# Patient Record
Sex: Female | Born: 1970
Health system: Southern US, Community
[De-identification: ages and names within clinical notes are randomized; demographics above are authoritative.]

## PROBLEM LIST (undated history)

## (undated) DIAGNOSIS — I319 Disease of pericardium, unspecified: Secondary | ICD-10-CM

## (undated) DIAGNOSIS — I776 Arteritis, unspecified: Secondary | ICD-10-CM

## (undated) DIAGNOSIS — M1990 Unspecified osteoarthritis, unspecified site: Secondary | ICD-10-CM

## (undated) HISTORY — PX: VARICOSE VEIN SURGERY: SHX832

---

## 1998-12-10 ENCOUNTER — Other Ambulatory Visit: Admission: RE | Admit: 1998-12-10 | Discharge: 1998-12-10 | Payer: Self-pay | Admitting: Obstetrics and Gynecology

## 2000-01-19 ENCOUNTER — Other Ambulatory Visit: Admission: RE | Admit: 2000-01-19 | Discharge: 2000-01-19 | Payer: Self-pay | Admitting: Obstetrics and Gynecology

## 2004-07-30 ENCOUNTER — Other Ambulatory Visit: Admission: RE | Admit: 2004-07-30 | Discharge: 2004-07-30 | Payer: Self-pay | Admitting: Obstetrics and Gynecology

## 2008-06-29 ENCOUNTER — Inpatient Hospital Stay (HOSPITAL_COMMUNITY): Admission: AD | Admit: 2008-06-29 | Discharge: 2008-06-29 | Payer: Self-pay | Admitting: Obstetrics & Gynecology

## 2011-03-08 ENCOUNTER — Ambulatory Visit: Payer: Self-pay | Admitting: Internal Medicine

## 2011-08-20 LAB — URINE CULTURE: Culture: NO GROWTH

## 2011-08-20 LAB — URINALYSIS, ROUTINE W REFLEX MICROSCOPIC
Glucose, UA: NEGATIVE
Glucose, UA: NEGATIVE
Ketones, ur: NEGATIVE
Protein, ur: NEGATIVE
Specific Gravity, Urine: 1.025

## 2011-08-20 LAB — URINE MICROSCOPIC-ADD ON

## 2011-08-20 LAB — POCT PREGNANCY, URINE: Preg Test, Ur: NEGATIVE

## 2011-08-20 LAB — WET PREP, GENITAL: Yeast Wet Prep HPF POC: NONE SEEN

## 2012-06-27 ENCOUNTER — Other Ambulatory Visit (HOSPITAL_COMMUNITY): Payer: Self-pay | Admitting: Physician Assistant

## 2012-06-27 DIAGNOSIS — Z1231 Encounter for screening mammogram for malignant neoplasm of breast: Secondary | ICD-10-CM

## 2012-07-12 ENCOUNTER — Ambulatory Visit (HOSPITAL_COMMUNITY)
Admission: RE | Admit: 2012-07-12 | Discharge: 2012-07-12 | Disposition: A | Discharge status: Home / Self Care | Payer: Self-pay | Source: Ambulatory Visit | Attending: Physician Assistant | Admitting: Physician Assistant

## 2012-07-12 DIAGNOSIS — Z1231 Encounter for screening mammogram for malignant neoplasm of breast: Secondary | ICD-10-CM

## 2013-05-03 ENCOUNTER — Emergency Department (HOSPITAL_COMMUNITY)
Admission: EM | Admit: 2013-05-03 | Discharge: 2013-05-03 | Disposition: A | Discharge status: Home / Self Care | Payer: Self-pay | Attending: Emergency Medicine | Admitting: Emergency Medicine

## 2013-05-03 ENCOUNTER — Encounter (HOSPITAL_COMMUNITY): Payer: Self-pay | Admitting: *Deleted

## 2013-05-03 DIAGNOSIS — M79609 Pain in unspecified limb: Secondary | ICD-10-CM

## 2013-05-03 DIAGNOSIS — IMO0001 Reserved for inherently not codable concepts without codable children: Secondary | ICD-10-CM | POA: Insufficient documentation

## 2013-05-03 DIAGNOSIS — M79605 Pain in left leg: Secondary | ICD-10-CM

## 2013-05-03 DIAGNOSIS — M7989 Other specified soft tissue disorders: Secondary | ICD-10-CM

## 2013-05-03 DIAGNOSIS — I8390 Asymptomatic varicose veins of unspecified lower extremity: Secondary | ICD-10-CM | POA: Insufficient documentation

## 2013-05-03 LAB — CBC WITH DIFFERENTIAL/PLATELET
Basophils Relative: 1 % (ref 0–1)
HCT: 37.5 % (ref 36.0–46.0)
Hemoglobin: 12.7 g/dL (ref 12.0–15.0)
Lymphs Abs: 2.7 10*3/uL (ref 0.7–4.0)
MCH: 30.7 pg (ref 26.0–34.0)
MCHC: 33.9 g/dL (ref 30.0–36.0)
Monocytes Absolute: 0.5 10*3/uL (ref 0.1–1.0)
Monocytes Relative: 7 % (ref 3–12)
Neutro Abs: 3.8 10*3/uL (ref 1.7–7.7)
Neutrophils Relative %: 52 % (ref 43–77)
RBC: 4.14 MIL/uL (ref 3.87–5.11)

## 2013-05-03 LAB — BASIC METABOLIC PANEL
BUN: 7 mg/dL (ref 6–23)
Chloride: 99 mEq/L (ref 96–112)
Creatinine, Ser: 0.94 mg/dL (ref 0.50–1.10)
Glucose, Bld: 90 mg/dL (ref 70–99)
Potassium: 3.8 mEq/L (ref 3.5–5.1)

## 2013-05-03 MED ORDER — TRAMADOL HCL 50 MG PO TABS: 50.0000 mg | ORAL_TABLET | Freq: Four times a day (QID) | ORAL | Status: DC | PRN

## 2013-05-03 NOTE — ED Notes (Signed)
Pt reports bilateral leg pain, pain worse to right leg. Pain described as achy and burning sensation. Right calf slightly swollen and pt reports "discoloration" from her norm. Has been going on for past two weeks. Pt on feet all day for work. Elevating leg helps relieve pain. Walking makes pain worse. Hx of varicose veins. Denies hx of blood clots

## 2013-05-03 NOTE — Progress Notes (Signed)
VASCULAR LAB PRELIMINARY  PRELIMINARY  PRELIMINARY  PRELIMINARY  Bilateral lower extremity venous duplex completed.    Preliminary report:  Bilateral:  No evidence of DVT, superficial thrombosis, or Baker's Cyst.   Reana Chacko, RVS 05/03/2013, 9:02 AM

## 2013-05-03 NOTE — ED Provider Notes (Signed)
Medical screening examination/treatment/procedure(s) were performed by non-physician practitioner and as supervising physician I was immediately available for consultation/collaboration.  Ethelda Chick, MD 05/03/13 [phone]

## 2013-05-03 NOTE — ED Notes (Signed)
Vascular tech at bedside. °

## 2013-05-03 NOTE — ED Provider Notes (Signed)
History     CSN: [id number]  Arrival date & time 05/03/13  4098   First MD Initiated Contact with Patient 05/03/13 0745      No chief complaint on file.   (Consider location/radiation/quality/duration/timing/severity/associated sxs/prior treatment) HPI Comments: Patient presents to the ED for bilateral lower extremity pain. Patient has history of varicose veins states that the past few days her legs have grown increasingly sore with calf swelling, cramping, and a sense of heaviness of BLE.  Pain worse with prolonged standing, elevating legs helps some.  She also has new bruises on the LE without history of trauma or injury.  No hx of thrombocytopenia.  No hx of DVT or PE.  Has had prior laser procedure for varicose veins by Dr. Guss Bunde and notes that she does have poor circulation.  Pt is not on lasix or blood thinners.  Denies any chest pain or SOB.  The history is provided by the patient.    No past medical history on file.  No past surgical history on file.  No family history on file.  History  Substance Use Topics  . Smoking status: Not on file  . Smokeless tobacco: Not on file  . Alcohol Use: Not on file    OB History   No data available      Review of Systems  Musculoskeletal: Positive for myalgias.  All other systems reviewed and are negative.    Allergies  Review of patient's allergies indicates not on file.  Home Medications  No current outpatient prescriptions on file.  BP 133/66  Pulse 82  Temp(Src) 97.9 F (36.6 C) (Oral)  Resp 18  SpO2 99%  Physical Exam  Nursing note and vitals reviewed. Constitutional: She is oriented to person, place, and time. She appears well-developed and well-nourished.  HENT:  Head: Normocephalic and atraumatic.  Eyes: Conjunctivae and EOM are normal.  Neck: Normal range of motion. Neck supple.  Cardiovascular: Normal rate, regular rhythm and normal heart sounds.   Pulmonary/Chest: Effort normal and breath  sounds normal. No respiratory distress. She has no wheezes.  Musculoskeletal: Normal range of motion.  Varicose veins and calf tenderness of BLE with mild asymmetry of right calf, no palpable cord, negative Homan's sign  Neurological: She is alert and oriented to person, place, and time. She has normal strength. No cranial nerve deficit or sensory deficit.  Skin: Skin is warm and dry.  Psychiatric: She has a normal mood and affect.    ED Course  Procedures (including critical care time)  Labs Reviewed  BASIC METABOLIC PANEL - Abnormal; Notable for the following:    Sodium 134 (*)    GFR calc non Af Amer 75 (*)    GFR calc Af Amer 87 (*)    All other components within normal limits  CBC WITH DIFFERENTIAL   No results found.   1. Varicose veins   2. Leg pain, bilateral       MDM   Pt concerned for DVT- bilateral LE dopplers ordered.  CBC and BMP to evaluate for thrombocytopenia and electrolyte imbalances to cause the sporadic bruising and cramping.  9:04 AM Doppler's negative for DVT.  Labs largely WNL.  Sx likely due to varicose veins.  Will have pt FU with Dr. Guss Bunde at vein clinic.  Instructed to elevate and rest her legs today.  Rx tramadol.  Discussed plan with pt, she agreed.  Return precautions advised.      Garlon Hatchet, PA-C 05/03/13 [phone]

## 2013-05-21 ENCOUNTER — Emergency Department (HOSPITAL_COMMUNITY)
Admission: EM | Admit: 2013-05-21 | Discharge: 2013-05-21 | Discharge status: Against Medical Advice | Payer: Self-pay | Attending: Emergency Medicine | Admitting: Emergency Medicine

## 2013-05-21 ENCOUNTER — Encounter (HOSPITAL_COMMUNITY): Payer: Self-pay | Admitting: *Deleted

## 2013-05-21 DIAGNOSIS — R079 Chest pain, unspecified: Secondary | ICD-10-CM | POA: Insufficient documentation

## 2013-05-21 DIAGNOSIS — R0602 Shortness of breath: Secondary | ICD-10-CM | POA: Insufficient documentation

## 2013-05-21 NOTE — Progress Notes (Signed)
[facility] did not get to see patient but will be sending her information about the [facility], using the address provided.  °

## 2013-05-21 NOTE — ED Notes (Signed)
Pt decided to leave AMA because of wait time.  Explained to pt that we were busy and it may be a little whilebefore Dr comes in. Pt still wanted to leave, left leaving no belonging and gown on the bed.

## 2013-05-21 NOTE — ED Notes (Signed)
ZOX:[id number]<AV> Expected date:<BR> Expected time:<BR> Means of arrival:<BR> Comments:<BR> Pt still in room

## 2013-05-21 NOTE — ED Notes (Addendum)
Pt reports central chest pain that started this morning, 10/10. Pain with inspiration and movement. SHOB started around time of chest pain. Pt able to speak in full sentences. Pt reports cocaine use a few days ago.  Pt did move furniture yesterday, but denied having any pain afterwards.

## 2013-08-23 ENCOUNTER — Other Ambulatory Visit (HOSPITAL_COMMUNITY): Payer: Self-pay | Admitting: Physician Assistant

## 2013-08-23 DIAGNOSIS — Z1231 Encounter for screening mammogram for malignant neoplasm of breast: Secondary | ICD-10-CM

## 2013-09-17 ENCOUNTER — Ambulatory Visit (HOSPITAL_COMMUNITY)
Admission: RE | Admit: 2013-09-17 | Discharge: 2013-09-17 | Disposition: A | Discharge status: Home / Self Care | Payer: BC Managed Care – PPO | Source: Ambulatory Visit | Attending: Physician Assistant | Admitting: Physician Assistant

## 2013-09-17 DIAGNOSIS — Z1231 Encounter for screening mammogram for malignant neoplasm of breast: Secondary | ICD-10-CM

## 2013-10-30 ENCOUNTER — Other Ambulatory Visit (HOSPITAL_COMMUNITY): Payer: Self-pay | Admitting: Physician Assistant

## 2013-10-30 DIAGNOSIS — Z1231 Encounter for screening mammogram for malignant neoplasm of breast: Secondary | ICD-10-CM

## 2013-11-14 ENCOUNTER — Ambulatory Visit (HOSPITAL_COMMUNITY)
Admission: RE | Admit: 2013-11-14 | Discharge: 2013-11-14 | Disposition: A | Discharge status: Home / Self Care | Payer: BC Managed Care – PPO | Source: Ambulatory Visit | Attending: Physician Assistant | Admitting: Physician Assistant

## 2013-11-14 DIAGNOSIS — Z1231 Encounter for screening mammogram for malignant neoplasm of breast: Secondary | ICD-10-CM

## 2013-12-05 ENCOUNTER — Encounter (HOSPITAL_COMMUNITY): Payer: Self-pay | Admitting: Emergency Medicine

## 2013-12-05 ENCOUNTER — Emergency Department (HOSPITAL_COMMUNITY)
Admission: EM | Admit: 2013-12-05 | Discharge: 2013-12-05 | Discharge status: Against Medical Advice | Payer: BC Managed Care – PPO | Attending: Emergency Medicine | Admitting: Emergency Medicine

## 2013-12-05 DIAGNOSIS — R209 Unspecified disturbances of skin sensation: Secondary | ICD-10-CM | POA: Insufficient documentation

## 2013-12-05 NOTE — ED Notes (Signed)
Pt went to front window and reported she was leaving pt seen walking out door

## 2013-12-05 NOTE — ED Notes (Signed)
Pt states numbness and tingling in hands and feet for 3 months.  Pain has worsened in last 2 weeks.  No new medications.  Pt able to ambulate.  Pt states has not noticed a temperature change.    No change in color.  Pt took aspirin earlier today.  Pt has taken tramadol for pain.

## 2013-12-05 NOTE — ED Notes (Signed)
Pt called for room unable to find x1

## 2013-12-19 ENCOUNTER — Encounter (HOSPITAL_BASED_OUTPATIENT_CLINIC_OR_DEPARTMENT_OTHER): Payer: Self-pay | Admitting: Emergency Medicine

## 2013-12-19 ENCOUNTER — Emergency Department (HOSPITAL_BASED_OUTPATIENT_CLINIC_OR_DEPARTMENT_OTHER)
Admission: EM | Admit: 2013-12-19 | Discharge: 2013-12-19 | Disposition: A | Discharge status: Home / Self Care | Payer: BC Managed Care – PPO | Attending: Emergency Medicine | Admitting: Emergency Medicine

## 2013-12-19 DIAGNOSIS — M25529 Pain in unspecified elbow: Secondary | ICD-10-CM | POA: Insufficient documentation

## 2013-12-19 DIAGNOSIS — M25579 Pain in unspecified ankle and joints of unspecified foot: Secondary | ICD-10-CM | POA: Insufficient documentation

## 2013-12-19 DIAGNOSIS — M2550 Pain in unspecified joint: Secondary | ICD-10-CM

## 2013-12-19 DIAGNOSIS — M25549 Pain in joints of unspecified hand: Secondary | ICD-10-CM | POA: Insufficient documentation

## 2013-12-19 DIAGNOSIS — M25569 Pain in unspecified knee: Secondary | ICD-10-CM | POA: Insufficient documentation

## 2013-12-19 DIAGNOSIS — M25539 Pain in unspecified wrist: Secondary | ICD-10-CM | POA: Insufficient documentation

## 2013-12-19 MED ORDER — TRAMADOL HCL 50 MG PO TABS: 50.0000 mg | ORAL_TABLET | Freq: Four times a day (QID) | ORAL | Status: DC | PRN

## 2013-12-19 MED ORDER — IBUPROFEN 800 MG PO TABS: 800.0000 mg | ORAL_TABLET | Freq: Three times a day (TID) | ORAL | Status: DC | PRN

## 2013-12-19 NOTE — ED Notes (Signed)
Pt c/o pain to knees, feet, fingers, wrists, elbows intermittent x weeks. Pt sts she has been taking mothers tramadol for pain. Pt rates pain 8/10.

## 2013-12-19 NOTE — ED Provider Notes (Signed)
CSN: [id number]     Arrival date & time 12/19/13  0736 History   First MD Initiated Contact with Patient 12/19/13 [phone]     Chief Complaint  Patient presents with  . Foot Pain  . Hand Pain   (Consider location/radiation/quality/duration/timing/severity/associated sxs/prior Treatment) Patient is a 43 y.o. female presenting with lower extremity pain and hand pain.  Foot Pain  Hand Pain   Pt presents with complaints of diffuse arthralgias intermittently over the last several months. Includes fingers, wrists, toes, ankles, elbows and knees. She states symptoms are often worse in the morning and improve with moving around. Some associated joint swelling at times but no fever. She reports improvement with Tramadol at times, but does not have PCP or other followup.   History reviewed. No pertinent past medical history. Past Surgical History  Procedure Laterality Date  . Varicose vein surgery     No family history on file. History  Substance Use Topics  . Smoking status: Never Smoker   . Smokeless tobacco: Not on file  . Alcohol Use: 0.0 oz/week    2-3 Cans of beer per week   OB History   Grav Para Term Preterm Abortions TAB SAB Ect Mult Living                 Review of Systems All other systems reviewed and are negative except as noted in HPI.   Allergies  Review of patient's allergies indicates no known allergies.  Home Medications   Current Outpatient Rx  Name  Route  Sig  Dispense  Refill  . aspirin EC 81 MG tablet   Oral   Take 162 mg by mouth every 6 (six) hours as needed for pain (headache).         . diphenhydrAMINE (BENADRYL) 25 mg capsule   Oral   Take 25 mg by mouth every 6 (six) hours as needed for itching (sleep).         Marland Kitchen ibuprofen (ADVIL,MOTRIN) 200 MG tablet   Oral   Take 200 mg by mouth every 6 (six) hours as needed for pain.         . Multiple Vitamin (MULTIVITAMIN WITH MINERALS) TABS   Oral   Take 1 tablet by mouth daily.         .  traMADol (ULTRAM) 50 MG tablet   Oral   Take 1 tablet (50 mg total) by mouth every 6 (six) hours as needed for pain.   20 tablet   0    BP 116/71  Pulse 85  Temp(Src) 97.7 F (36.5 C) (Oral)  Resp 16  Ht 5\' 8"  (1.727 m)  Wt 160 lb (72.576 kg)  BMI 24.33 kg/m2  SpO2 100%  LMP 12/16/2013 Physical Exam  Nursing note and vitals reviewed. Constitutional: She is oriented to person, place, and time. She appears well-developed and well-nourished.  HENT:  Head: Normocephalic and atraumatic.  Eyes: EOM are normal. Pupils are equal, round, and reactive to light.  Neck: Normal range of motion. Neck supple.  Cardiovascular: Normal rate, normal heart sounds and intact distal pulses.   Pulmonary/Chest: Effort normal and breath sounds normal.  Abdominal: Bowel sounds are normal. She exhibits no distension. There is no tenderness.  Musculoskeletal: Normal range of motion. She exhibits tenderness (tender over joints of fingers which show some arthritic changes). She exhibits no edema.  Neurological: She is alert and oriented to person, place, and time. She has normal strength. No cranial nerve deficit or sensory deficit.  Skin: Skin is warm and dry. No rash noted.  Psychiatric: She has a normal mood and affect.    ED Course  Procedures (including critical care time) Labs Review Labs Reviewed - No data to display Imaging Review No results found.  EKG Interpretation   None       MDM   1. Arthralgia     Long standing arthralgias of unclear etiology, although I suspect RA. Pt advised that workup for her symptoms would need to be done in a PCP or Rheumatology setting. Advised NSAIDs and Tramadol as needed for pain in the meantime, but I stressed importance of establishing with PCP.    Charles B. Bernette MayersSheldon, MD 12/19/13 [phone]

## 2013-12-19 NOTE — Discharge Instructions (Signed)
Arthralgia °Your caregiver has diagnosed you as suffering from an arthralgia. Arthralgia means there is pain in a joint. This can come from many reasons including: °· Bruising the joint which causes soreness (inflammation) in the joint. °· Wear and tear on the joints which occur as we grow older (osteoarthritis). °· Overusing the joint. °· Various forms of arthritis. °· Infections of the joint. °Regardless of the cause of pain in your joint, most of these different pains respond to anti-inflammatory drugs and rest. The exception to this is when a joint is infected, and these cases are treated with antibiotics, if it is a bacterial infection. °HOME CARE INSTRUCTIONS  °· Rest the injured area for as long as directed by your caregiver. Then slowly start using the joint as directed by your caregiver and as the pain allows. Crutches as directed may be useful if the ankles, knees or hips are involved. If the knee was splinted or casted, continue use and care as directed. If an stretchy or elastic wrapping bandage has been applied today, it should be removed and re-applied every 3 to 4 hours. It should not be applied tightly, but firmly enough to keep swelling down. Watch toes and feet for swelling, bluish discoloration, coldness, numbness or excessive pain. If any of these problems (symptoms) occur, remove the ace bandage and re-apply more loosely. If these symptoms persist, contact your caregiver or return to this location. °· For the first 24 hours, keep the injured extremity elevated on pillows while lying down. °· Apply ice for 15-20 minutes to the sore joint every couple hours while awake for the first half day. Then 03-04 times per day for the first 48 hours. Put the ice in a plastic bag and place a towel between the bag of ice and your skin. °· Wear any splinting, casting, elastic bandage applications, or slings as instructed. °· Only take over-the-counter or prescription medicines for pain, discomfort, or fever as  directed by your caregiver. Do not use aspirin immediately after the injury unless instructed by your physician. Aspirin can cause increased bleeding and bruising of the tissues. °· If you were given crutches, continue to use them as instructed and do not resume weight bearing on the sore joint until instructed. °Persistent pain and inability to use the sore joint as directed for more than 2 to 3 days are warning signs indicating that you should see a caregiver for a follow-up visit as soon as possible. Initially, a hairline fracture (break in bone) may not be evident on X-rays. Persistent pain and swelling indicate that further evaluation, non-weight bearing or use of the joint (use of crutches or slings as instructed), or further X-rays are indicated. X-rays may sometimes not show a small fracture until a week or 10 days later. Make a follow-up appointment with your own caregiver or one to whom we have referred you. A radiologist (specialist in reading X-rays) may read your X-rays. Make sure you know how you are to obtain your X-ray results. Do not assume everything is normal if you do not hear from us. °SEEK MEDICAL CARE IF: °Bruising, swelling, or pain increases. °SEEK IMMEDIATE MEDICAL CARE IF:  °· Your fingers or toes are numb or blue. °· The pain is not responding to medications and continues to stay the same or get worse. °· The pain in your joint becomes severe. °· You develop a fever over 102° F (38.9° C). °· It becomes impossible to move or use the joint. °MAKE SURE YOU:  °·   Understand these instructions.  Will watch your condition.  Will get help right away if you are not doing well or get worse. Document Released: 11/08/2005 Document Revised: 01/31/2012 Document Reviewed: 06/26/2008 [facility] Patient Information 2014 [employer], [place]. Rheumatoid Arthritis Rheumatoid arthritis is a long-term (chronic) inflammatory disease that causes pain, swelling, and stiffness of the joints. It can affect the  entire body, including the eyes and lungs. The effects of rheumatoid arthritis vary widely among those with the condition. CAUSES  The cause of rheumatoid arthritis is not known. It tends to run in families and is more common in women. Certain cells of the body's natural defense system (immune system) do not work properly and begin to attack healthy joints. It primarily involves the connective tissue that lines the joints (synovial membrane). This can cause damage to the joint. SYMPTOMS   Pain, stiffness, swelling, and decreased motion of many joints, especially in the hands and feet.  Stiffness that is worse in the morning. It may last 1 2 hours or longer.  Numbness and tingling in the hands.  Fatigue.  Loss of appetite.  Weight loss.  Low-grade fever.  Dry eyes and mouth.  Firm lumps (rheumatoid nodules) that grow beneath the skin in areas such as the elbows and hands. DIAGNOSIS  Diagnosis is based on the symptoms described, an exam, and blood tests. Sometimes, X-rays are helpful. TREATMENT  The goals of treatment are to relieve pain, reduce inflammation, and to slow down or stop joint damage and disability. Methods vary and may include:  Maintaining a balance of rest, exercise, and proper nutrition.  Medicines:  Pain relievers (analgesics).  Corticosteroids and nonsteroidal anti-inflammatory drugs (NSAIDs) to reduce inflammation.  Disease-modifying antirheumatic drugs (DMARDs) to try to slow the course of the disease.  Biologic response modifiers to reduce inflammation and damage.  Physical therapy and occupational therapy.  Surgery for patients with severe joint damage. Joint replacement or fusing of joints may be needed.  Routine monitoring and ongoing care, such as office visits, blood and urine tests, and X-rays. HOME CARE INSTRUCTIONS   Remain physically active and reduce activity when the disease gets worse.  Eat a well-balanced diet.  Put heat on affected  joints when you wake up and before activities. Keep the heat on the affected joint for as long as directed by your caregiver.  Put ice on affected joints following activities or exercising.  Put ice in a plastic bag.  Place a towel between your skin and the bag.  Leave the ice on for 15-20 minutes, 03-04 times a day.  Take all medicines and supplements as directed by your caregiver.  Use splints as directed by your caregiver. Splints help maintain joint position and function.  Do not sleep with pillows under your knees. This may lead to spasms.  Participate in a self-management program to keep current with the latest treatment and coping skills. SEEK IMMEDIATE MEDICAL CARE IF:  You have fainting episodes.  You have periods of extreme weakness.  You rapidly develop a hot, painful joint that is more severe than usual joint aches.  You have chills.  You have a fever. MAKE SURE YOU:  Understand these instructions.  Will watch your condition.  Will get help right away if you are not doing well or get worse. FOR MORE INFORMATION  [employer] College [employer] Rheumatology: www.rheumatology.org Arthritis Foundation: www.arthritis.org Document Released: 11/05/2000 Document Revised: 05/09/2012 Document Reviewed: 12/15/2011 [facility]Exit[facility] Patient Information 2014 [place] ValleyExitCare, [place]LL[place].

## 2014-04-08 ENCOUNTER — Inpatient Hospital Stay (HOSPITAL_COMMUNITY)
Admission: EM | Admit: 2014-04-08 | Discharge: 2014-04-11 | DRG: 316 | Disposition: A | Discharge status: Home / Self Care | Payer: BC Managed Care – PPO | Attending: Internal Medicine | Admitting: Internal Medicine

## 2014-04-08 ENCOUNTER — Emergency Department (HOSPITAL_COMMUNITY): Payer: BC Managed Care – PPO

## 2014-04-08 ENCOUNTER — Inpatient Hospital Stay (HOSPITAL_COMMUNITY): Payer: BC Managed Care – PPO

## 2014-04-08 ENCOUNTER — Encounter (HOSPITAL_COMMUNITY): Payer: Self-pay | Admitting: Emergency Medicine

## 2014-04-08 DIAGNOSIS — Z79899 Other long term (current) drug therapy: Secondary | ICD-10-CM

## 2014-04-08 DIAGNOSIS — D649 Anemia, unspecified: Secondary | ICD-10-CM | POA: Diagnosis present

## 2014-04-08 DIAGNOSIS — M129 Arthropathy, unspecified: Secondary | ICD-10-CM | POA: Diagnosis present

## 2014-04-08 DIAGNOSIS — IMO0002 Reserved for concepts with insufficient information to code with codable children: Secondary | ICD-10-CM

## 2014-04-08 DIAGNOSIS — I776 Arteritis, unspecified: Secondary | ICD-10-CM | POA: Diagnosis present

## 2014-04-08 DIAGNOSIS — I309 Acute pericarditis, unspecified: Secondary | ICD-10-CM | POA: Diagnosis present

## 2014-04-08 DIAGNOSIS — R9431 Abnormal electrocardiogram [ECG] [EKG]: Secondary | ICD-10-CM | POA: Diagnosis present

## 2014-04-08 DIAGNOSIS — F1410 Cocaine abuse, uncomplicated: Secondary | ICD-10-CM

## 2014-04-08 DIAGNOSIS — R079 Chest pain, unspecified: Secondary | ICD-10-CM

## 2014-04-08 DIAGNOSIS — I319 Disease of pericardium, unspecified: Secondary | ICD-10-CM | POA: Diagnosis present

## 2014-04-08 DIAGNOSIS — I959 Hypotension, unspecified: Secondary | ICD-10-CM | POA: Diagnosis present

## 2014-04-08 DIAGNOSIS — D72829 Elevated white blood cell count, unspecified: Secondary | ICD-10-CM | POA: Diagnosis present

## 2014-04-08 DIAGNOSIS — R0789 Other chest pain: Secondary | ICD-10-CM | POA: Diagnosis present

## 2014-04-08 DIAGNOSIS — Z7982 Long term (current) use of aspirin: Secondary | ICD-10-CM

## 2014-04-08 HISTORY — DX: Unspecified osteoarthritis, unspecified site: M19.90

## 2014-04-08 HISTORY — DX: Arteritis, unspecified: I77.6

## 2014-04-08 LAB — CBC WITH DIFFERENTIAL/PLATELET
BASOS ABS: 0 10*3/uL (ref 0.0–0.1)
Basophils Relative: 0 % (ref 0–1)
Eosinophils Absolute: 0 10*3/uL (ref 0.0–0.7)
Eosinophils Relative: 0 % (ref 0–5)
HCT: 34.6 % — ABNORMAL LOW (ref 36.0–46.0)
HEMOGLOBIN: 11.6 g/dL — AB (ref 12.0–15.0)
LYMPHS PCT: 8 % — AB (ref 12–46)
Lymphs Abs: 1.8 10*3/uL (ref 0.7–4.0)
MCH: 30.3 pg (ref 26.0–34.0)
MCHC: 33.5 g/dL (ref 30.0–36.0)
MCV: 90.3 fL (ref 78.0–100.0)
MONO ABS: 1.4 10*3/uL — AB (ref 0.1–1.0)
Monocytes Relative: 7 % (ref 3–12)
NEUTROS ABS: 18.7 10*3/uL — AB (ref 1.7–7.7)
Neutrophils Relative %: 85 % — ABNORMAL HIGH (ref 43–77)
Platelets: 437 10*3/uL — ABNORMAL HIGH (ref 150–400)
RBC: 3.83 MIL/uL — ABNORMAL LOW (ref 3.87–5.11)
RDW: 13.8 % (ref 11.5–15.5)
WBC: 22 10*3/uL — AB (ref 4.0–10.5)

## 2014-04-08 LAB — URINALYSIS, ROUTINE W REFLEX MICROSCOPIC
Glucose, UA: NEGATIVE mg/dL
HGB URINE DIPSTICK: NEGATIVE
Ketones, ur: NEGATIVE mg/dL
LEUKOCYTES UA: NEGATIVE
NITRITE: NEGATIVE
PROTEIN: NEGATIVE mg/dL
SPECIFIC GRAVITY, URINE: 1.029 (ref 1.005–1.030)
UROBILINOGEN UA: 1 mg/dL (ref 0.0–1.0)
pH: 5.5 (ref 5.0–8.0)

## 2014-04-08 LAB — D-DIMER, QUANTITATIVE (NOT AT ARMC): D DIMER QUANT: 0.86 ug{FEU}/mL — AB (ref 0.00–0.48)

## 2014-04-08 LAB — RAPID URINE DRUG SCREEN, HOSP PERFORMED
Amphetamines: NOT DETECTED
Barbiturates: NOT DETECTED
Benzodiazepines: POSITIVE — AB
COCAINE: POSITIVE — AB
OPIATES: NOT DETECTED
Tetrahydrocannabinol: NOT DETECTED

## 2014-04-08 LAB — TROPONIN I

## 2014-04-08 LAB — BASIC METABOLIC PANEL
BUN: 12 mg/dL (ref 6–23)
CHLORIDE: 98 meq/L (ref 96–112)
CO2: 26 mEq/L (ref 19–32)
Calcium: 9.7 mg/dL (ref 8.4–10.5)
Creatinine, Ser: 0.99 mg/dL (ref 0.50–1.10)
GFR calc non Af Amer: 70 mL/min — ABNORMAL LOW (ref >90)
GFR, EST AFRICAN AMERICAN: 81 mL/min — AB (ref >90)
Glucose, Bld: 131 mg/dL — ABNORMAL HIGH (ref 70–99)
POTASSIUM: 4 meq/L (ref 3.7–5.3)
Sodium: 135 mEq/L — ABNORMAL LOW (ref 137–147)

## 2014-04-08 LAB — POC URINE PREG, ED: Preg Test, Ur: NEGATIVE

## 2014-04-08 LAB — I-STAT TROPONIN, ED: TROPONIN I, POC: 0 ng/mL (ref 0.00–0.08)

## 2014-04-08 MED ORDER — COLCHICINE 0.6 MG PO TABS
0.6000 mg | ORAL_TABLET | Freq: Two times a day (BID) | ORAL | Status: DC
  Administered 2014-04-08 – 2014-04-11 (×6): 0.6 mg via ORAL
  Filled 2014-04-08 (×9): qty 1

## 2014-04-08 MED ORDER — SODIUM CHLORIDE 0.9 % IV BOLUS (SEPSIS)
1000.0000 mL | Freq: Once | INTRAVENOUS | Status: AC
  Administered 2014-04-08: 1000 mL via INTRAVENOUS

## 2014-04-08 MED ORDER — LORAZEPAM 2 MG/ML IJ SOLN
1.0000 mg | Freq: Once | INTRAMUSCULAR | Status: AC
  Administered 2014-04-08: 1 mg via INTRAVENOUS
  Filled 2014-04-08: qty 1

## 2014-04-08 MED ORDER — IBUPROFEN 800 MG PO TABS
800.0000 mg | ORAL_TABLET | Freq: Four times a day (QID) | ORAL | Status: DC
  Administered 2014-04-08 – 2014-04-10 (×7): 800 mg via ORAL
  Filled 2014-04-08 (×10): qty 1

## 2014-04-08 MED ORDER — SODIUM CHLORIDE 0.9 % IV SOLN
INTRAVENOUS | Status: AC
  Administered 2014-04-08 – 2014-04-09 (×3): via INTRAVENOUS

## 2014-04-08 MED ORDER — ASPIRIN 325 MG PO TABS
325.0000 mg | ORAL_TABLET | Freq: Once | ORAL | Status: AC
  Administered 2014-04-08: 325 mg via ORAL
  Filled 2014-04-08: qty 1

## 2014-04-08 MED ORDER — METHYLPREDNISOLONE SODIUM SUCC 125 MG IJ SOLR
60.0000 mg | Freq: Every day | INTRAMUSCULAR | Status: DC
Start: 2014-04-08 — End: 2014-04-10
  Administered 2014-04-08 – 2014-04-10 (×3): 60 mg via INTRAVENOUS
  Filled 2014-04-08 (×2): qty 0.96
  Filled 2014-04-08: qty 2

## 2014-04-08 NOTE — ED Notes (Signed)
Pt c/o ches tpain since yesterday. Pt c/o nausea, weakness, SOB. Pt admits to cocaine use today. A&Ox4. Pt appears pale.

## 2014-04-08 NOTE — H&P (Addendum)
Patient Demographics  [name] Santana, is a 43 y.o. female  MRN: [id number]   DOB - 04-14-1971  Admit Date - 04/08/2014  Outpatient Primary MD for the patient is No PCP Per Patient   With History of -  Past Medical History  Diagnosis Date  . Vasculitis   . Arthritis       Past Surgical History  Procedure Laterality Date  . Varicose vein surgery      in for   Chief Complaint  Patient presents with  . Chest Pain     HPI  [name] Santana  is a 43 y.o. female,  with history of vasculitis unclear type follows with a rheumatologist Dr. Dierdre Forth and is on prednisone and methotrexate, arthritis, cocaine abuse last used yesterday, comes to the hospital with a one-day history of substernal dull constant chest pain which is nonradiating associated with mild shortness of breath, worse by movement and leaning back, somewhat better with rest and leaning forwards, no cough, no fever chills lately, denies any diarrhea, no blood in stool or urine, no recent long drives or travels, no personal history of blood clots, came to the ER where she was found to be slightly hypotensive, EKG showed diffuse ST elevation suggestive of pericarditis, cardiologist Dr. Excell Seltzer was consulted over the phone who requested stepdown admission by medicine in consultation by cardiology. Cardiology fellow has been informed he has agreed to see the patient here today.   Of note patient's troponin is negative, she is sleeping in bed and not very cooperative with the exam, she does agree that she used cocaine yesterday after the chest pain started, she appears nontoxic, in no distress.    Review of Systems    In addition to the HPI above,   No Fever-chills, No Headache, No changes with Vision or hearing, No problems swallowing food or  Liquids, As above Chest pain, Cough or Shortness of Breath, No Abdominal pain, No Nausea or Vommitting, Bowel movements are regular, No Blood in stool or Urine, No dysuria, No new skin rashes or bruises, No new joints pains-aches,  No new weakness, tingling, numbness in any extremity, No recent weight gain or loss, No polyuria, polydypsia or polyphagia, No significant Mental Stressors.  A full 10 point Review of Systems was done, except as stated above, all other Review of Systems were negative.   Social History History  Substance Use Topics  . Smoking status: Never Smoker   . Smokeless tobacco: Never Used  . Alcohol Use: 0.0 oz/week    2-3 Cans of beer per week      Family History No history of CAD at young age in the family  Prior to Admission medications   Medication Sig Start Date End Date Taking? Authorizing Provider  folic acid (FOLVITE) 1 MG tablet Take 1 mg by mouth daily.   Yes Historical Provider, MD  methotrexate (RHEUMATREX) 2.5 MG tablet Take 20 mg by mouth once a week. Caution:Chemotherapy.  Protect from light.   Yes Historical Provider, MD  predniSONE (DELTASONE) 10 MG tablet Take 10 mg by mouth daily with breakfast.   Yes Historical Provider, MD  aspirin EC 81 MG tablet Take 162 mg by mouth every 6 (six) hours as needed for pain (headache).    Historical Provider, MD  diphenhydrAMINE (BENADRYL) 25 mg capsule Take 25 mg by mouth every 6 (six) hours as needed for itching (sleep).    Historical Provider, MD  ibuprofen (ADVIL,MOTRIN) 800 MG tablet Take 1 tablet (800 mg total) by mouth every 8 (eight) hours as needed. 12/19/13   Charles B. Bernette MayersSheldon, MD  Multiple Vitamin (MULTIVITAMIN WITH MINERALS) TABS Take 1 tablet by mouth daily.    Historical Provider, MD  traMADol (ULTRAM) 50 MG tablet Take 1 tablet (50 mg total) by mouth every 6 (six) hours as needed. 12/19/13   Charles B. Bernette MayersSheldon, MD    No Known Allergies  Physical Exam  Vitals  Blood pressure 102/65, pulse  100, temperature 98.9 F (37.2 C), temperature source Oral, resp. rate 29, last menstrual period 03/25/2014, SpO2 100.00%.   1. General Young African American female lying in bed in NAD,     2. Normal affect and insight, Not Suicidal or Homicidal, sleepy but answers questions, not cooperative with exam, Oriented X 3.  3. No F.N deficits, ALL C.Nerves Intact, Strength 5/5 all 4 extremities, Sensation intact all 4 extremities, Plantars down going.  4. Ears and Eyes appear Normal, Conjunctivae clear, PERRLA. Moist Oral Mucosa.  5. Supple Neck, No JVD, No cervical lymphadenopathy appriciated, No Carotid Bruits.  6. Symmetrical Chest wall movement, Good air movement bilaterally, CTAB.  7. RRR, No Gallops, positive pericardial Rub but no Murmurs, No Parasternal Heave.  8. Positive Bowel Sounds, Abdomen Soft, Non tender, No organomegaly appriciated,No rebound -guarding or rigidity.  9.  No Cyanosis, Normal Skin Turgor, No Skin Rash or Bruise.  10. Good muscle tone,  joints appear normal , no effusions, Normal ROM.  11. No Palpable Lymph Nodes in Neck or Axillae     Data Review  CBC  Recent Labs Lab 04/08/14 2010  WBC 22.0*  HGB 11.6*  HCT 34.6*  PLT 437*  MCV 90.3  MCH 30.3  MCHC 33.5  RDW 13.8  LYMPHSABS 1.8  MONOABS 1.4*  EOSABS 0.0  BASOSABS 0.0   ------------------------------------------------------------------------------------------------------------------  Chemistries   Recent Labs Lab 04/08/14 2010  NA 135*  K 4.0  CL 98  CO2 26  GLUCOSE 131*  BUN 12  CREATININE 0.99  CALCIUM 9.7   ------------------------------------------------------------------------------------------------------------------ CrCl is unknown because both a height and weight (above a minimum accepted value) are required for this calculation. ------------------------------------------------------------------------------------------------------------------ No results found for this  basename: TSH, T4TOTAL, FREET3, T3FREE, THYROIDAB,  in the last 72 hours   Coagulation profile No results found for this basename: INR, PROTIME,  in the last 168 hours ------------------------------------------------------------------------------------------------------------------- No results found for this basename: DDIMER,  in the last 72 hours -------------------------------------------------------------------------------------------------------------------  Cardiac Enzymes No results found for this basename: CK, CKMB, TROPONINI, MYOGLOBIN,  in the last 168 hours ------------------------------------------------------------------------------------------------------------------ No components found with this basename: POCBNP,    ---------------------------------------------------------------------------------------------------------------  Urinalysis    Component Value Date/Time   COLORURINE YELLOW 06/29/2008 2010   APPEARANCEUR CLEAR 06/29/2008 2010   LABSPEC 1.025 06/29/2008 2010   PHURINE 6.0 06/29/2008 2010   GLUCOSEU NEGATIVE 06/29/2008 2010   HGBUR TRACE* 06/29/2008 2010   BILIRUBINUR NEGATIVE 06/29/2008 2010   KETONESUR NEGATIVE 06/29/2008 2010  PROTEINUR NEGATIVE 06/29/2008 2010   UROBILINOGEN 0.2 06/29/2008 2010   NITRITE NEGATIVE 06/29/2008 2010   LEUKOCYTESUR SMALL* 06/29/2008 2010    ----------------------------------------------------------------------------------------------------------------  Imaging results:   Dg Chest Portable 1 View  04/08/2014   CLINICAL DATA:  Chest pain.  EXAM: PORTABLE CHEST - 1 VIEW  COMPARISON:  Mar 22, 2014.  FINDINGS: The heart size and mediastinal contours are within normal limits. Both lungs are clear. No pneumothorax or pleural effusion is noted. Hypoinflation of the lungs is noted most likely due to poor inspiratory effort. The visualized skeletal structures are unremarkable.  IMPRESSION: No acute cardiopulmonary abnormality seen.   Electronically Signed    By: Roque LiasJames  Green M.D.   On: 04/08/2014 21:06    My personal review of EKG: Rhythm NSR, Rate 98 /min, diffuse ST elevation    Assessment & Plan  1.Chest pain due to acute pericarditis. She does have history of vasculitis along with arthritis. Is on methotrexate and prednisone - at this moment she will be admitted to step down bed, will cycle troponin, place her on scheduled NSAID - colchicine and aspirin, since she is hypotensive and on oral prednisone chronically at home she will be placed on IV Solu-Medrol for both inflammation and for being under stress with hypotension. IV fluids bolus and then maintenance. Cardiology to see. Echo ordered. Monitor on telemetry.    My suspicion for a PE is very low, if she does not show rapid clinical improvement then CT angiogram can be considered. Have ordered d-dimer however it is likely to be high due to pericardial inflammation and with history of vasculitis, negative test will be of some use.     2. History of cocaine abuse. Last use yesterday, counseled to quit, urine drug screen ordered along with urine pregnancy.     3. History of vasculitis and arthritis. Continue methotrexate, switch from prednisone to IV Solu-Medrol as she is hypotensive, she goes to Dr. Dierdre ForthBeekman for her rheumatological needs. Please obtain records in the morning.     4. Leukocytosis. Likely reactionary due to #1, also on prednisone, chest x-ray unremarkable. Check UA, no diarrhea. Monitor.      Note urine pregnancy has been ordered kindly monitor.     DVT Prophylaxis  SCDs    AM Labs Ordered, also please review Full Orders  Family Communication: Admission, patients condition and plan of care including tests being ordered have been discussed with the patient  who indicates understanding and agree with the plan and Code Status.  Code Status Full  Likely DC to  Home  Condition GUARDED     Time spent in minutes : 35    Leroy SeaPrashant K Tyaisha Cullom M.D on  04/08/2014 at 10:37 PM  Between 7am to 7pm - Pager - [phone]  After 7pm go to [email or web address] - password [email or web address]  And look for the night coverage person covering me after hours  [facility] Office  [phone]

## 2014-04-08 NOTE — ED Notes (Signed)
Significant other would like to be notified of any changes:  Tyson BabinskiLawrence Watson -[phone]

## 2014-04-08 NOTE — ED Provider Notes (Signed)
CSN: [id number]     Arrival date & time 04/08/14  1929 History   None    Chief Complaint  Patient presents with  . Chest Pain     (Consider location/radiation/quality/duration/timing/severity/associated sxs/prior Treatment) Patient is a 43 y.o. female presenting with chest pain. The history is provided by the patient. No language interpreter was used.  Chest Pain Pain location:  Substernal area Pain quality: pressure   Pain radiates to:  Does not radiate Pain radiates to the back: no   Pain severity:  Moderate Onset quality:  Unable to specify Duration:  1 day Timing:  Constant Progression:  Unchanged Chronicity:  New Context: at rest   Associated symptoms: fatigue and shortness of breath   Associated symptoms: no abdominal pain, no back pain, no cough, no diaphoresis, no fever, no headache, no nausea, no numbness, no palpitations, not vomiting and no weakness     Past Medical History  Diagnosis Date  . Vasculitis   . Arthritis    Past Surgical History  Procedure Laterality Date  . Varicose vein surgery     History reviewed. No pertinent family history. History  Substance Use Topics  . Smoking status: Never Smoker   . Smokeless tobacco: Never Used  . Alcohol Use: 0.0 oz/week    2-3 Cans of beer per week   OB History   Grav Para Term Preterm Abortions TAB SAB Ect Mult Living                 Review of Systems  Constitutional: Positive for fatigue. Negative for fever, chills, diaphoresis, activity change and appetite change.  HENT: Negative for congestion, facial swelling, rhinorrhea and sore throat.   Eyes: Negative for photophobia and discharge.  Respiratory: Positive for shortness of breath. Negative for cough and chest tightness.   Cardiovascular: Positive for chest pain. Negative for palpitations and leg swelling.  Gastrointestinal: Negative for nausea, vomiting, abdominal pain and diarrhea.  Endocrine: Negative for polydipsia and polyuria.  Genitourinary:  Negative for dysuria, frequency, difficulty urinating and pelvic pain.  Musculoskeletal: Negative for arthralgias, back pain, neck pain and neck stiffness.  Skin: Negative for color change and wound.  Allergic/Immunologic: Negative for immunocompromised state.  Neurological: Negative for facial asymmetry, weakness, numbness and headaches.  Hematological: Does not bruise/bleed easily.  Psychiatric/Behavioral: Negative for confusion and agitation.      Allergies  Review of patient's allergies indicates no known allergies.  Home Medications   Prior to Admission medications   Medication Sig Start Date End Date Taking? Authorizing Provider  aspirin EC 81 MG tablet Take 162 mg by mouth every 6 (six) hours as needed for pain (headache).    Historical Provider, MD  diphenhydrAMINE (BENADRYL) 25 mg capsule Take 25 mg by mouth every 6 (six) hours as needed for itching (sleep).    Historical Provider, MD  ibuprofen (ADVIL,MOTRIN) 800 MG tablet Take 1 tablet (800 mg total) by mouth every 8 (eight) hours as needed. 12/19/13   Charles B. Bernette Mayers, MD  Multiple Vitamin (MULTIVITAMIN WITH MINERALS) TABS Take 1 tablet by mouth daily.    Historical Provider, MD  traMADol (ULTRAM) 50 MG tablet Take 1 tablet (50 mg total) by mouth every 6 (six) hours as needed. 12/19/13   Charles B. Bernette Mayers, MD   BP 110/72  Pulse 118  Temp(Src) 99.4 F (37.4 C) (Oral)  Resp 29  SpO2 100%  LMP 03/25/2014 Physical Exam  Constitutional: She is oriented to person, place, and time. She appears well-developed and well-nourished.  No distress.  HENT:  Head: Normocephalic and atraumatic.  Mouth/Throat: No oropharyngeal exudate.  Eyes: Pupils are equal, round, and reactive to light.  Neck: Normal range of motion. Neck supple.  Cardiovascular: Regular rhythm.  Tachycardia present.  Exam reveals friction rub. Exam reveals no gallop.   No murmur heard. Pulmonary/Chest: Effort normal and breath sounds normal. No respiratory  distress. She has no wheezes. She has no rales.  Abdominal: Soft. Bowel sounds are normal. She exhibits no distension and no mass. There is no tenderness. There is no rebound and no guarding.  Musculoskeletal: Normal range of motion. She exhibits no edema and no tenderness.  Neurological: She is alert and oriented to person, place, and time.  Skin: Skin is warm and dry. There is pallor.  Psychiatric: She has a normal mood and affect.    ED Course  Procedures (including critical care time) Labs Review Labs Reviewed  CBC WITH DIFFERENTIAL - Abnormal; Notable for the following:    WBC 22.0 (*)    RBC 3.83 (*)    Hemoglobin 11.6 (*)    HCT 34.6 (*)    Platelets 437 (*)    Neutrophils Relative % 85 (*)    Neutro Abs 18.7 (*)    Lymphocytes Relative 8 (*)    Monocytes Absolute 1.4 (*)    All other components within normal limits  BASIC METABOLIC PANEL - Abnormal; Notable for the following:    Sodium 135 (*)    Glucose, Bld 131 (*)    GFR calc non Af Amer 70 (*)    GFR calc Af Amer 81 (*)    All other components within normal limits  D-DIMER, QUANTITATIVE - Abnormal; Notable for the following:    D-Dimer, Quant 0.86 (*)    All other components within normal limits  URINALYSIS, ROUTINE W REFLEX MICROSCOPIC  URINE RAPID DRUG SCREEN (HOSP PERFORMED)  TROPONIN I  TROPONIN I  TROPONIN I  I-STAT TROPOININ, ED  POC URINE PREG, ED  POC URINE PREG, ED    Imaging Review Dg Chest Portable 1 View  04/08/2014   CLINICAL DATA:  Chest pain.  EXAM: PORTABLE CHEST - 1 VIEW  COMPARISON:  Mar 22, 2014.  FINDINGS: The heart size and mediastinal contours are within normal limits. Both lungs are clear. No pneumothorax or pleural effusion is noted. Hypoinflation of the lungs is noted most likely due to poor inspiratory effort. The visualized skeletal structures are unremarkable.  IMPRESSION: No acute cardiopulmonary abnormality seen.   Electronically Signed   By: Roque LiasJames  Green M.D.   On: 04/08/2014 21:06      EKG Interpretation   Date/Time:  Monday Apr 08 2014 19:43:07 EDT Ventricular Rate:  99 PR Interval:  134 QRS Duration: 77 QT Interval:  334 QTC Calculation: 429 R Axis:   61 Text Interpretation:  Sinus rhythm ST Elevations diffusely, no reciprocal  changes diffuse ischemia vs pericarditis Confirmed by DOCHERTY  MD, MEGAN  ([id number]) on 04/08/2014 8:03:36 PM      MDM   Final diagnoses:  Pericarditis  Chest pain    Pt is a 43 y.o. female with Pmhx as above who presents with CP since yesterday morning, substernal w/o radiation, improved by sitting up, worse by deep breath, cough, sneezing.  +assoc SOB, nausea, malaise. She reports intermittent fevers but blames this on methotrexate.  She used cocaine about 6-7 hours ago (was already having pain at this time). Pain worse laying down, better sitting up. EKG w/ diffuse STE, PR  depressions, tachycardia. On PE, pt tachycardic, ill appearing, BP in 90's systolic. She has tachycardia and rub. No LE edema. Spoke w/ Cardiology, Dr. Copper who feels Hx and EKG classic for pericarditis. [facility].   [facility]. First trop negative. WBC 22 (steroids vs infectious cause).  CXR nml. Have added ibuprofen and colchicine. Pt's bolused with IVF x2 for hypotension in 80's systolic. Cardiology fellow [facility], his bedside echo showed trace pericardial effusion.         Shanna CiscoMegan E Docherty, MD 04/08/14 [phone]

## 2014-04-08 NOTE — ED Notes (Signed)
Pt states that she began to have substernal chest pain on 5/17; pt c/o feeling weak and short of breath; pt states that she just began on Methotrexate 1 week ago

## 2014-04-08 NOTE — ED Notes (Signed)
Pt placed on 2 L. 

## 2014-04-08 NOTE — Progress Notes (Signed)
Discussed case with Dr Micheline Mazeocherty. 43 year-old woman with hx of vasculitis, presenting with chest pain since yesterday. Pain worse with cough and improved with sitting upright.   EKG reviewed and shows diffuse ST elevation and PR depression.   Symptoms and EKG are typical for acute pericarditis. Pt noted to be hypotensive. Advised IV fluid bolus and [facility]. Will ask Cards Fellow to admit and arrange stat echo if persistent hypotension noted.   Tonny BollmanMichael Dakai Braithwaite 04/08/2014 8:17 PM

## 2014-04-09 DIAGNOSIS — I309 Acute pericarditis, unspecified: Principal | ICD-10-CM

## 2014-04-09 DIAGNOSIS — I517 Cardiomegaly: Secondary | ICD-10-CM

## 2014-04-09 DIAGNOSIS — I319 Disease of pericardium, unspecified: Secondary | ICD-10-CM

## 2014-04-09 LAB — COMPREHENSIVE METABOLIC PANEL WITH GFR
ALT: 10 U/L (ref 0–35)
AST: 11 U/L (ref 0–37)
Albumin: 2.5 g/dL — ABNORMAL LOW (ref 3.5–5.2)
Alkaline Phosphatase: 77 U/L (ref 39–117)
BUN: 10 mg/dL (ref 6–23)
CO2: 23 meq/L (ref 19–32)
Calcium: 8.8 mg/dL (ref 8.4–10.5)
Chloride: 102 meq/L (ref 96–112)
Creatinine, Ser: 0.73 mg/dL (ref 0.50–1.10)
GFR calc Af Amer: >90 mL/min
GFR calc non Af Amer: >90 mL/min
Glucose, Bld: 141 mg/dL — ABNORMAL HIGH (ref 70–99)
Potassium: 4.5 meq/L (ref 3.7–5.3)
Sodium: 135 meq/L — ABNORMAL LOW (ref 137–147)
Total Bilirubin: 0.7 mg/dL (ref 0.3–1.2)
Total Protein: 6.2 g/dL (ref 6.0–8.3)

## 2014-04-09 LAB — FERRITIN: Ferritin: 78 ng/mL (ref 10–291)

## 2014-04-09 LAB — CBC
HCT: 30.3 % — ABNORMAL LOW (ref 36.0–46.0)
Hemoglobin: 9.9 g/dL — ABNORMAL LOW (ref 12.0–15.0)
MCH: 29.3 pg (ref 26.0–34.0)
MCHC: 32.7 g/dL (ref 30.0–36.0)
MCV: 89.6 fL (ref 78.0–100.0)
Platelets: 348 10*3/uL (ref 150–400)
RBC: 3.38 MIL/uL — ABNORMAL LOW (ref 3.87–5.11)
RDW: 13.8 % (ref 11.5–15.5)
WBC: 16.9 10*3/uL — AB (ref 4.0–10.5)

## 2014-04-09 LAB — RETICULOCYTES
RBC.: 3.3 MIL/uL — ABNORMAL LOW (ref 3.87–5.11)
RETIC CT PCT: 1.2 % (ref 0.4–3.1)
Retic Count, Absolute: 39.6 10*3/uL (ref 19.0–186.0)

## 2014-04-09 LAB — IRON AND TIBC
Iron: 13 ug/dL — ABNORMAL LOW (ref 42–135)
SATURATION RATIOS: 5 % — AB (ref 20–55)
TIBC: 278 ug/dL (ref 250–470)
UIBC: 265 ug/dL (ref 125–400)

## 2014-04-09 LAB — VITAMIN B12: VITAMIN B 12: 469 pg/mL (ref 211–911)

## 2014-04-09 LAB — TROPONIN I

## 2014-04-09 LAB — FOLATE: Folate: >20 ng/mL

## 2014-04-09 LAB — MRSA PCR SCREENING: MRSA by PCR: NEGATIVE

## 2014-04-09 MED ORDER — ASPIRIN 325 MG PO TABS
325.0000 mg | ORAL_TABLET | Freq: Every day | ORAL | Status: DC
  Administered 2014-04-09 – 2014-04-11 (×3): 325 mg via ORAL
  Filled 2014-04-09 (×3): qty 1

## 2014-04-09 MED ORDER — METHOTREXATE 2.5 MG PO TABS: 20.0000 mg | ORAL_TABLET | ORAL | Status: DC

## 2014-04-09 MED ORDER — DIPHENHYDRAMINE HCL 25 MG PO CAPS: 25.0000 mg | ORAL_CAPSULE | Freq: Four times a day (QID) | ORAL | Status: DC | PRN

## 2014-04-09 MED ORDER — FOLIC ACID 1 MG PO TABS
1.0000 mg | ORAL_TABLET | Freq: Every day | ORAL | Status: DC
  Administered 2014-04-09 – 2014-04-11 (×3): 1 mg via ORAL
  Filled 2014-04-09 (×3): qty 1

## 2014-04-09 MED ORDER — ONDANSETRON HCL 4 MG PO TABS: 4.0000 mg | ORAL_TABLET | Freq: Four times a day (QID) | ORAL | Status: DC | PRN

## 2014-04-09 MED ORDER — SODIUM CHLORIDE 0.9 % IV BOLUS (SEPSIS)
1000.0000 mL | INTRAVENOUS | Status: DC | PRN
  Administered 2014-04-09: 1000 mL via INTRAVENOUS

## 2014-04-09 MED ORDER — GUAIFENESIN-DM 100-10 MG/5ML PO SYRP
5.0000 mL | ORAL_SOLUTION | ORAL | Status: DC | PRN
  Administered 2014-04-10 – 2014-04-11 (×2): 5 mL via ORAL
  Filled 2014-04-09 (×2): qty 10

## 2014-04-09 MED ORDER — HYDROCODONE-ACETAMINOPHEN 5-325 MG PO TABS
1.0000 | ORAL_TABLET | ORAL | Status: DC | PRN
  Administered 2014-04-10: 2 via ORAL
  Filled 2014-04-09: qty 2

## 2014-04-09 MED ORDER — ONDANSETRON HCL 4 MG/2ML IJ SOLN: 4.0000 mg | Freq: Four times a day (QID) | INTRAMUSCULAR | Status: DC | PRN

## 2014-04-09 MED ORDER — POLYETHYLENE GLYCOL 3350 17 G PO PACK
17.0000 g | PACK | Freq: Every day | ORAL | Status: DC | PRN
  Filled 2014-04-09: qty 1

## 2014-04-09 MED ORDER — SODIUM CHLORIDE 0.9 % IJ SOLN
3.0000 mL | Freq: Two times a day (BID) | INTRAMUSCULAR | Status: DC
  Administered 2014-04-09 – 2014-04-10 (×3): 3 mL via INTRAVENOUS

## 2014-04-09 MED ORDER — ADULT MULTIVITAMIN W/MINERALS CH
1.0000 | ORAL_TABLET | Freq: Every day | ORAL | Status: DC
  Administered 2014-04-09 – 2014-04-11 (×3): 1 via ORAL
  Filled 2014-04-09 (×3): qty 1

## 2014-04-09 NOTE — ED Notes (Signed)
Pt called out to go to bathroom. Pt. Placed on bedpan. Pt A&Ox4, sitting up by herself in bed. Pt asking to talk on phone.

## 2014-04-09 NOTE — Consult Note (Signed)
Cardiology Consultation Note  Patient ID: [name] Santana, MRN: [id number], DOB/AGE: Dec 20, 1970 43 y.o. Admit date: 04/08/2014   Date of Consult: 04/09/2014 Primary Physician: No PCP Per Patient Primary Cardiologist: Nill  Chief Complaint: Chest pain    Reason for Consult: chest pain   HPI: 43 yr old female with hx of vasculitis admitted with chest pain .   Pt states that the chest pain started yesterday . Is achy in nature, constant , substernal worse with deep breaths and lying flat with some improvement in leaning forward. Pt denies any such prior episode. In the ED EKG suggestive of pericarditis. Brief period of hypotension responded to IV fluids  Bedside echocardigram in the ER ; small circumferential pericardial effusion with no evidence of chamber collages. IVC collapses and does not appear dilated   Past Medical History  Diagnosis Date  . Vasculitis   . Arthritis       Most Recent Cardiac Studies: nill    Surgical History:  Past Surgical History  Procedure Laterality Date  . Varicose vein surgery       Home Meds: Prior to Admission medications   Medication Sig Start Date End Date Taking? Authorizing Provider  folic acid (FOLVITE) 1 MG tablet Take 1 mg by mouth daily.   Yes Historical Provider, MD  ibuprofen (ADVIL,MOTRIN) 800 MG tablet Take 1 tablet (800 mg total) by mouth every 8 (eight) hours as needed. 12/19/13  Yes Charles B. Bernette MayersSheldon, MD  methotrexate (RHEUMATREX) 2.5 MG tablet Take 20 mg by mouth once a week. Caution:Chemotherapy. Protect from light.   Yes Historical Provider, MD  Multiple Vitamin (MULTIVITAMIN WITH MINERALS) TABS Take 1 tablet by mouth daily.   Yes Historical Provider, MD  predniSONE (DELTASONE) 10 MG tablet Take 10 mg by mouth daily with breakfast.   Yes Historical Provider, MD    Inpatient Medications:  . colchicine  0.6 mg Oral BID  . ibuprofen  800 mg Oral Q6H  . methylPREDNISolone (SOLU-MEDROL) injection  60 mg Intravenous Daily   .  sodium chloride 125 mL/hr at 04/08/14 2234    Allergies: No Known Allergies  History   Social History  . Marital Status: Single    Spouse Name: N/A    Number of Children: N/A  . Years of Education: N/A   Occupational History  . Not on file.   Social History Main Topics  . Smoking status: Never Smoker   . Smokeless tobacco: Never Used  . Alcohol Use: 0.0 oz/week    2-3 Cans of beer per week  . Drug Use: Yes    Special: Cocaine  . Sexual Activity: Not on file   Other Topics Concern  . Not on file   Social History Narrative  . No narrative on file     History reviewed. No pertinent family history.   Review of Systems: General: negative for chills, fever, night sweats or weight changes.  Cardiovascular: negative for  edema, orthopnea, palpitations, paroxysmal nocturnal dyspnea, shortness of breath or dyspnea on exertion Dermatological: negative for rash Respiratory: negative for cough or wheezing Urologic: negative for hematuria Abdominal: negative for nausea, vomiting, diarrhea, bright red blood per rectum, melena, or hematemesis Neurologic: negative for visual changes, syncope, or dizziness All other systems reviewed and are otherwise negative except as noted above.  Labs:  Recent Labs  04/08/14 2235  TROPONINI <0.30   Lab Results  Component Value Date   WBC 22.0* 04/08/2014   HGB 11.6* 04/08/2014   HCT 34.6* 04/08/2014  MCV 90.3 04/08/2014   PLT 437* 04/08/2014    Recent Labs Lab 04/08/14 2010  NA 135*  K 4.0  CL 98  CO2 26  BUN 12  CREATININE 0.99  CALCIUM 9.7  GLUCOSE 131*   No results found for this basename: CHOL, HDL, LDLCALC, TRIG   Lab Results  Component Value Date   DDIMER 0.86* 04/08/2014    Radiology/Studies:  Dg Chest Portable 1 View  04/08/2014   CLINICAL DATA:  Chest pain.  EXAM: PORTABLE CHEST - 1 VIEW  COMPARISON:  Mar 22, 2014.  FINDINGS: The heart size and mediastinal contours are within normal limits. Both lungs are clear. No  pneumothorax or pleural effusion is noted. Hypoinflation of the lungs is noted most likely due to poor inspiratory effort. The visualized skeletal structures are unremarkable.  IMPRESSION: No acute cardiopulmonary abnormality seen.   Electronically Signed   By: Roque LiasJames  Green M.D.   On: 04/08/2014 21:06    EKG: NSR, Diffuse ST elevation with PR elevation in avR  Suggestive of pericarditis  Physical Exam: Blood pressure 110/72, pulse 118, temperature 99.4 F (37.4 C), temperature source Oral, resp. rate 29, last menstrual period 03/25/2014, SpO2 100.00%. General: Well developed, well nourished, mild distress from pain  Head: Normocephalic, atraumatic, sclera non-icteric, no xanthomas, nares are without discharge.  Neck: Negative for carotid bruits. JVD not elevated. Lungs: Clear bilaterally to auscultation without wheezes, rales, or rhonchi. Breathing is unlabored. Heart: RRR with S1 S2. Prominent pericardial rub  Abdomen: Soft, non-tender, non-distended with normoactive bowel sounds. No hepatomegaly. No rebound/guarding. No obvious abdominal masses. Msk:  Strength and tone appear normal for age. Extremities: No clubbing or cyanosis. No edema.  Distal pedal pulses are 2+ and equal bilaterally. Neuro: Alert and oriented X 3. No facial asymmetry. No focal deficit. Moves all extremities spontaneously. Psych:  Responds to questions appropriately with a normal affect.     Assessment and Plan:  Acute pericarditis   Recs -High dose NSAID with colchicine  -Formal Echo in am to follow  -Address underlying etiology ? Vasculitis related per primary team   Signed, Crissie SicklesMian A Nijae Doyel M.D  04/09/2014, 12:00 AM

## 2014-04-09 NOTE — Progress Notes (Signed)
Nutrition Brief Note  Patient identified on the Malnutrition Screening Tool (MST) Report  Wt Readings from Last 15 Encounters:  04/09/14 173 lb 8 oz (78.7 kg)  12/19/13 160 lb (72.576 kg)    Body mass index is 26.39 kg/(m^2). Patient meets criteria for overweight based on current BMI.   Current diet order is regular, patient is consuming approximately 100% of meals at this time per pt report. Labs and medications reviewed. Pt with history of vasculitis, arthritis, cocaine abuse last used 2 days ago, comes to the hospital with a one-day history of substernal dull constant chest pain. Found to have acute pericarditis.   Met with pt who reports she has been trying to eat healthy at home and eats 2 meals/day of things like baked chicken or roast beef and salads. Weight up from several months ago. Had nausea yesterday but none today. Reports eating well.   No nutrition interventions warranted at this time. If nutrition issues arise, please consult RD.   Mikey College MS, [place], [place] Pager [phone] After Hours Pager

## 2014-04-09 NOTE — Progress Notes (Signed)
DAILY PROGRESS NOTE  Subjective:  Feels better, less chest pain. Still has diffuse ST elevation on her EKG.  BP is low normal.  Objective:  Temp:  [98.7 F (37.1 C)-99.4 F (37.4 C)] 98.7 F (37.1 C) (05/19 0400) Pulse Rate:  [88-118] 95 (05/19 0600) Resp:  [17-32] 21 (05/19 0600) BP: (89-116)/(55-73) 101/70 mmHg (05/19 0600) SpO2:  [96 %-100 %] 98 % (05/19 0600) Weight:  [173 lb 8 oz (78.7 kg)] 173 lb 8 oz (78.7 kg) (05/19 0245) Weight change:   Intake/Output from previous day: 05/18 0701 - 05/19 0700 In: 625 [I.V.:625] Out: 350 [Urine:350]  Intake/Output from this shift:    Medications: Current Facility-Administered Medications  Medication Dose Route Frequency Provider Last Rate Last Dose  . 0.9 %  sodium chloride infusion   Intravenous Continuous Thurnell Lose, MD 125 mL/hr at 04/09/14 0729    . aspirin tablet 325 mg  325 mg Oral Daily Thurnell Lose, MD      . colchicine tablet 0.6 mg  0.6 mg Oral BID Neta Ehlers, MD   0.6 mg at 04/08/14 2259  . diphenhydrAMINE (BENADRYL) capsule 25 mg  25 mg Oral Q6H PRN Thurnell Lose, MD      . folic acid (FOLVITE) tablet 1 mg  1 mg Oral Daily Thurnell Lose, MD      . guaiFENesin-dextromethorphan (ROBITUSSIN DM) 100-10 MG/5ML syrup 5 mL  5 mL Oral Q4H PRN Thurnell Lose, MD      . HYDROcodone-acetaminophen (NORCO/VICODIN) 5-325 MG per tablet 1-2 tablet  1-2 tablet Oral Q4H PRN Thurnell Lose, MD      . ibuprofen (ADVIL,MOTRIN) tablet 800 mg  800 mg Oral Q6H Neta Ehlers, MD   800 mg at 04/09/14 4536  . [START ON 04/13/2014] methotrexate (RHEUMATREX) tablet 20 mg  20 mg Oral Weekly Thurnell Lose, MD      . methylPREDNISolone sodium succinate (SOLU-MEDROL) 125 mg/2 mL injection 60 mg  60 mg Intravenous Daily Thurnell Lose, MD   60 mg at 04/08/14 2238  . multivitamin with minerals tablet 1 tablet  1 tablet Oral Daily Thurnell Lose, MD      . ondansetron (ZOFRAN) tablet 4 mg  4 mg Oral Q6H PRN Thurnell Lose, MD       Or  . ondansetron (ZOFRAN) injection 4 mg  4 mg Intravenous Q6H PRN Thurnell Lose, MD      . polyethylene glycol (MIRALAX / GLYCOLAX) packet 17 g  17 g Oral Daily PRN Thurnell Lose, MD      . sodium chloride 0.9 % bolus 1,000 mL  1,000 mL Intravenous PRN Thurnell Lose, MD      . sodium chloride 0.9 % injection 3 mL  3 mL Intravenous Q12H Thurnell Lose, MD   3 mL at 04/09/14 0340    Physical Exam: General appearance: alert and no distress Neck: no carotid bruit and no JVD Lungs: clear to auscultation bilaterally Heart: regular rate and rhythm, S1, S2 normal and friction rub heard multicomponent Abdomen: soft, non-tender; bowel sounds normal; no masses,  no organomegaly Extremities: extremities normal, atraumatic, no cyanosis or edema Pulses: 2+ and symmetric Skin: Skin color, texture, turgor normal. No rashes or lesions Neurologic: Grossly normal Psych: Pleasant  Lab Results: Results for orders placed during the hospital encounter of 04/08/14 (from the past 48 hour(s))  CBC WITH DIFFERENTIAL     Status: Abnormal   Collection  Time    04/08/14  8:10 PM      Result Value Ref Range   WBC 22.0 (*) 4.0 - 10.5 K/uL   RBC 3.83 (*) 3.87 - 5.11 MIL/uL   Hemoglobin 11.6 (*) 12.0 - 15.0 g/dL   HCT 34.6 (*) 36.0 - 46.0 %   MCV 90.3  78.0 - 100.0 fL   MCH 30.3  26.0 - 34.0 pg   MCHC 33.5  30.0 - 36.0 g/dL   RDW 13.8  11.5 - 15.5 %   Platelets 437 (*) 150 - 400 K/uL   Neutrophils Relative % 85 (*) 43 - 77 %   Neutro Abs 18.7 (*) 1.7 - 7.7 K/uL   Lymphocytes Relative 8 (*) 12 - 46 %   Lymphs Abs 1.8  0.7 - 4.0 K/uL   Monocytes Relative 7  3 - 12 %   Monocytes Absolute 1.4 (*) 0.1 - 1.0 K/uL   Eosinophils Relative 0  0 - 5 %   Eosinophils Absolute 0.0  0.0 - 0.7 K/uL   Basophils Relative 0  0 - 1 %   Basophils Absolute 0.0  0.0 - 0.1 K/uL  BASIC METABOLIC PANEL     Status: Abnormal   Collection Time    04/08/14  8:10 PM      Result Value Ref Range   Sodium  135 (*) 137 - 147 mEq/L   Potassium 4.0  3.7 - 5.3 mEq/L   Chloride 98  96 - 112 mEq/L   CO2 26  19 - 32 mEq/L   Glucose, Bld 131 (*) 70 - 99 mg/dL   BUN 12  6 - 23 mg/dL   Creatinine, Ser 0.99  0.50 - 1.10 mg/dL   Calcium 9.7  8.4 - 10.5 mg/dL   GFR calc non Af Amer 70 (*) >90 mL/min   GFR calc Af Amer 81 (*) >90 mL/min   Comment: (NOTE)     The eGFR has been calculated using the CKD EPI equation.     This calculation has not been validated in all clinical situations.     eGFR's persistently <90 mL/min signify possible Chronic Kidney     Disease.  Randolm Idol, ED     Status: None   Collection Time    04/08/14  8:19 PM      Result Value Ref Range   Troponin i, poc 0.00  0.00 - 0.08 ng/mL   Comment 3            Comment: Due to the release kinetics of cTnI,     a negative result within the first hours     of the onset of symptoms does not rule out     myocardial infarction with certainty.     If myocardial infarction is still suspected,     repeat the test at appropriate intervals.  TROPONIN I     Status: None   Collection Time    04/08/14 10:35 PM      Result Value Ref Range   Troponin I <0.30  <0.30 ng/mL   Comment:            Due to the release kinetics of cTnI,     a negative result within the first hours     of the onset of symptoms does not rule out     myocardial infarction with certainty.     If myocardial infarction is still suspected,     repeat the test at appropriate intervals.  D-DIMER, QUANTITATIVE     Status: Abnormal   Collection Time    04/08/14 10:35 PM      Result Value Ref Range   D-Dimer, Quant 0.86 (*) 0.00 - 0.48 ug/mL-FEU   Comment:            AT THE INHOUSE ESTABLISHED CUTOFF     VALUE OF 0.48 ug/mL FEU,     THIS ASSAY HAS BEEN DOCUMENTED     IN THE LITERATURE TO HAVE     A SENSITIVITY AND NEGATIVE     PREDICTIVE VALUE OF AT LEAST     98 TO 99%.  THE TEST RESULT     SHOULD BE CORRELATED WITH     AN ASSESSMENT OF THE CLINICAL      PROBABILITY OF DVT / VTE.  URINALYSIS, ROUTINE W REFLEX MICROSCOPIC     Status: Abnormal   Collection Time    04/08/14 10:59 PM      Result Value Ref Range   Color, Urine AMBER (*) YELLOW   Comment: BIOCHEMICALS MAY BE AFFECTED BY COLOR   APPearance CLEAR  CLEAR   Specific Gravity, Urine 1.029  1.005 - 1.030   pH 5.5  5.0 - 8.0   Glucose, UA NEGATIVE  NEGATIVE mg/dL   Hgb urine dipstick NEGATIVE  NEGATIVE   Bilirubin Urine SMALL (*) NEGATIVE   Ketones, ur NEGATIVE  NEGATIVE mg/dL   Protein, ur NEGATIVE  NEGATIVE mg/dL   Urobilinogen, UA 1.0  0.0 - 1.0 mg/dL   Nitrite NEGATIVE  NEGATIVE   Leukocytes, UA NEGATIVE  NEGATIVE   Comment: MICROSCOPIC NOT DONE ON URINES WITH NEGATIVE PROTEIN, BLOOD, LEUKOCYTES, NITRITE, OR GLUCOSE <1000 mg/dL.  URINE RAPID DRUG SCREEN (HOSP PERFORMED)     Status: Abnormal   Collection Time    04/08/14 10:59 PM      Result Value Ref Range   Opiates NONE DETECTED  NONE DETECTED   Cocaine POSITIVE (*) NONE DETECTED   Benzodiazepines POSITIVE (*) NONE DETECTED   Amphetamines NONE DETECTED  NONE DETECTED   Tetrahydrocannabinol NONE DETECTED  NONE DETECTED   Barbiturates NONE DETECTED  NONE DETECTED   Comment:            DRUG SCREEN FOR MEDICAL PURPOSES     ONLY.  IF CONFIRMATION IS NEEDED     FOR ANY PURPOSE, NOTIFY LAB     WITHIN 5 DAYS.                LOWEST DETECTABLE LIMITS     FOR URINE DRUG SCREEN     Drug Class       Cutoff (ng/mL)     Amphetamine      1000     Barbiturate      200     Benzodiazepine   026     Tricyclics       378     Opiates          300     Cocaine          300     THC              50  POC URINE PREG, ED     Status: None   Collection Time    04/08/14 11:08 PM      Result Value Ref Range   Preg Test, Ur NEGATIVE  NEGATIVE   Comment:            THE SENSITIVITY OF THIS  METHODOLOGY IS >24 mIU/mL  MRSA PCR SCREENING     Status: None   Collection Time    04/09/14  2:45 AM      Result Value Ref Range   MRSA by PCR  NEGATIVE  NEGATIVE   Comment:            The GeneXpert MRSA Assay (FDA     approved for NASAL specimens     only), is one component of a     comprehensive MRSA colonization     surveillance program. It is not     intended to diagnose MRSA     infection nor to guide or     monitor treatment for     MRSA infections.  TROPONIN I     Status: None   Collection Time    04/09/14  4:29 AM      Result Value Ref Range   Troponin I <0.30  <0.30 ng/mL   Comment:            Due to the release kinetics of cTnI,     a negative result within the first hours     of the onset of symptoms does not rule out     myocardial infarction with certainty.     If myocardial infarction is still suspected,     repeat the test at appropriate intervals.  CBC     Status: Abnormal   Collection Time    04/09/14  4:29 AM      Result Value Ref Range   WBC 16.9 (*) 4.0 - 10.5 K/uL   RBC 3.38 (*) 3.87 - 5.11 MIL/uL   Hemoglobin 9.9 (*) 12.0 - 15.0 g/dL   HCT 31.5 (*) 40.0 - 86.7 %   MCV 89.6  78.0 - 100.0 fL   MCH 29.3  26.0 - 34.0 pg   MCHC 32.7  30.0 - 36.0 g/dL   RDW 61.9  50.9 - 32.6 %   Platelets 348  150 - 400 K/uL  COMPREHENSIVE METABOLIC PANEL     Status: Abnormal   Collection Time    04/09/14  4:29 AM      Result Value Ref Range   Sodium 135 (*) 137 - 147 mEq/L   Potassium 4.5  3.7 - 5.3 mEq/L   Chloride 102  96 - 112 mEq/L   CO2 23  19 - 32 mEq/L   Glucose, Bld 141 (*) 70 - 99 mg/dL   BUN 10  6 - 23 mg/dL   Creatinine, Ser 7.12  0.50 - 1.10 mg/dL   Calcium 8.8  8.4 - 45.8 mg/dL   Total Protein 6.2  6.0 - 8.3 g/dL   Albumin 2.5 (*) 3.5 - 5.2 g/dL   AST 11  0 - 37 U/L   ALT 10  0 - 35 U/L   Alkaline Phosphatase 77  39 - 117 U/L   Total Bilirubin 0.7  0.3 - 1.2 mg/dL   GFR calc non Af Amer >90  >90 mL/min   GFR calc Af Amer >90  >90 mL/min   Comment: (NOTE)     The eGFR has been calculated using the CKD EPI equation.     This calculation has not been validated in all clinical situations.      eGFR's persistently <90 mL/min signify possible Chronic Kidney     Disease.    Imaging: Dg Chest Portable 1 View  04/08/2014   CLINICAL DATA:  Chest pain.  EXAM: PORTABLE CHEST -  1 VIEW  COMPARISON:  Mar 22, 2014.  FINDINGS: The heart size and mediastinal contours are within normal limits. Both lungs are clear. No pneumothorax or pleural effusion is noted. Hypoinflation of the lungs is noted most likely due to poor inspiratory effort. The visualized skeletal structures are unremarkable.  IMPRESSION: No acute cardiopulmonary abnormality seen.   Electronically Signed   By: Sabino Dick M.D.   On: 04/08/2014 21:06    Assessment:  1. Principal Problem: 2.   ST segment elevation - pericarditis 3. Active Problems: 4.   Pericarditis 5.   Chest pain of pericarditis 6.   Vasculitis 7.   Acute pericarditis 8.   Plan:  1. Formal echo today to evaluate extent of pericarditis and effusion.  Trononin negative x 2, suggesting no myocardial involvement. Leukocytosis is improving.  She is on colchicine, full dose aspirin and ibuprofen 800 mg QID.  Time Spent Directly with Patient:  15 minutes  Length of Stay:  LOS: 1 day   Pixie Casino, MD, [facility] Attending Cardiologist [place] 04/09/2014, 8:19 AM

## 2014-04-09 NOTE — Progress Notes (Signed)
[facility] PROGRESS NOTE  [name] ConverseStefanie Santana ZOX:[id number] DOB: Aug 17, 1971 DOA: 04/08/2014 PCP: No PCP Per Patient Brief narrative: Patient is a [age] with history of vasculitis unclear type follows with rheumatologist Dr.Beekman and is on prednisone and methotrexate. She presented with one-day complaint of substernal: Constant chest discomfort. EKG in the ED showed diffuse ST elevations and patient was diagnosed with pericarditis. Cardiology subsequently consulted.  Assessment/Plan:  Principal Problem:   ST segment elevation - pericarditis - Cardiology on board and managing - Echocardiogram pending.  Vasculitis - stable  Leukocytosis - most likely due to recent Steroid use  Anemia - No active bleeding currently - Will obtain anemia panel`  Code Status: full Family Communication: Discussed directly with patient Disposition Plan: Pending improvement in condition   Consultants:  Cardiology  Procedures:  Echocardiogram pending  Antibiotics:  None  HPI/Subjective: No new complaints. No acute issues overnight  Objective: Filed Vitals:   04/09/14 1536  BP:   Pulse:   Temp: 98.2 F (36.8 C)  Resp:     Intake/Output Summary (Last 24 hours) at 04/09/14 1610 Last data filed at 04/09/14 1557  Gross per 24 hour  Intake   1500 ml  Output    950 ml  Net    550 ml   Filed Weights   04/09/14 0245  Weight: 78.7 kg (173 lb 8 oz)    Exam:   General:  Pt in NAD, alert and awake  Cardiovascular: RRR, no Murmurs  Respiratory: CTA BL, no wheezes, no rhales  Abdomen: soft, NT, ND  Musculoskeletal: no cyanosis or clubbing   Data Reviewed: Basic Metabolic Panel:  Recent Labs Lab 04/08/14 2010 04/09/14 0429  NA 135* 135*  K 4.0 4.5  CL 98 102  CO2 26 23  GLUCOSE 131* 141*  BUN 12 10  CREATININE 0.99 0.73  CALCIUM 9.7 8.8   Liver Function Tests:  Recent Labs Lab 04/09/14 0429  AST 11  ALT 10  ALKPHOS 77  BILITOT 0.7  PROT 6.2  ALBUMIN  2.5*   No results found for this basename: LIPASE, AMYLASE,  in the last 168 hours No results found for this basename: AMMONIA,  in the last 168 hours CBC:  Recent Labs Lab 04/08/14 2010 04/09/14 0429  WBC 22.0* 16.9*  NEUTROABS 18.7*  --   HGB 11.6* 9.9*  HCT 34.6* 30.3*  MCV 90.3 89.6  PLT 437* 348   Cardiac Enzymes:  Recent Labs Lab 04/08/14 2235 04/09/14 0429 04/09/14 1051  TROPONINI <0.30 <0.30 <0.30   BNP (last 3 results) No results found for this basename: PROBNP,  in the last 8760 hours CBG: No results found for this basename: GLUCAP,  in the last 168 hours  Recent Results (from the past 240 hour(s))  MRSA PCR SCREENING     Status: None   Collection Time    04/09/14  2:45 AM      Result Value Ref Range Status   MRSA by PCR NEGATIVE  NEGATIVE Final   Comment:            The GeneXpert MRSA Assay (FDA     approved for NASAL specimens     only), is one component of a     comprehensive MRSA colonization     surveillance program. It is not     intended to diagnose MRSA     infection nor to guide or     monitor treatment for     MRSA infections.     Studies:  Dg Chest Portable 1 View  04/08/2014   CLINICAL DATA:  Chest pain.  EXAM: PORTABLE CHEST - 1 VIEW  COMPARISON:  Mar 22, 2014.  FINDINGS: The heart size and mediastinal contours are within normal limits. Both lungs are clear. No pneumothorax or pleural effusion is noted. Hypoinflation of the lungs is noted most likely due to poor inspiratory effort. The visualized skeletal structures are unremarkable.  IMPRESSION: No acute cardiopulmonary abnormality seen.   Electronically Signed   By: Roque LiasJames  Green M.D.   On: 04/08/2014 21:06    Scheduled Meds: . aspirin  325 mg Oral Daily  . colchicine  0.6 mg Oral BID  . folic acid  1 mg Oral Daily  . ibuprofen  800 mg Oral Q6H  . [START ON 04/13/2014] methotrexate  20 mg Oral Weekly  . methylPREDNISolone (SOLU-MEDROL) injection  60 mg Intravenous Daily  . multivitamin  with minerals  1 tablet Oral Daily  . sodium chloride  3 mL Intravenous Q12H   Continuous Infusions: . sodium chloride 125 mL/hr at 04/09/14 0729     Time spent: > 35 minutes    [name] Santana  [facility] Pager [phone]. If 7PM-7AM, please contact night-coverage at [email or web address], password [facility] 04/09/2014, 4:10 PM  LOS: 1 day

## 2014-04-09 NOTE — Progress Notes (Signed)
Echocardiogram 2D Echocardiogram has been performed.  Genene ChurnJames M Saryiah Bencosme 04/09/2014, 3:53 PM

## 2014-04-09 NOTE — Discharge Instructions (Signed)
Pericarditis Pericarditis is swelling (inflammation) of the pericardium. The pericardium is a thin, double-layered, fluid-filled tissue sac that surrounds the heart. The purpose of the pericardium is to contain the heart in the chest cavity and keep the heart from overexpanding. Different types of pericarditis can occur, such as:  Acute pericarditis. Inflammation can develop suddenly in acute pericarditis.  Chronic pericarditis. Inflammation develops gradually and is long-lasting in chronic pericarditis.  Constrictive pericarditis. In this type of pericarditis, the layers of the pericardium stiffen and develop scar tissue. The scar tissue thickens and sticks together. This makes it difficult for the heart to pump and work as it normally does. CAUSES  Pericarditis can be caused from different conditions, such as:  A bacterial, fungal or viral infection.  After a heart attack (myocardial infarction).  After open-heart surgery (coronary bypass graft surgery).  Auto-immune conditions such as lupus, rheumatoid arthritis or scleroderma.  Kidney failure.  Low thyroid condition (hypothyroidism).  Cancer from another part of the body that has spread (metastasized) to the pericardium.  Chest injury or trauma.  After radiation treatment.  Certain medicines. SYMPTOMS  Symptoms of pericarditis can include:  Chest pain. Chest pain symptoms may increase when laying down and may be relieved when sitting up and leaning forward.  A chronic, dry cough.  Heart palpitations. These may feel like rapid, fluttering or pounding heart beats.  Chest pain may be worse when swallowing.  Dizziness or fainting.  Tiredness, fatigue or lethargy.  Fever. DIAGNOSIS  Pericarditis is diagnosed by the following:  A physical exam. A heart sound called a pericardial friction rub may be heard when your caregiver listens to your heart.  Blood work. Blood may be drawn to check for an infection and to look at  your blood chemistry.  Electrocardiography. During electrocardiography your heart's electrical activity is monitored and recorded with a tracing on paper (electrocardiogram [ECG]).  Echocardiography.  Computed tomography (CT).  Magnetic resonance image (MRI). TREATMENT  To treat pericarditis, it is important to know the cause of it. The cause of pericarditis determines the treatment.   If the cause of pericarditis is due to an infection, treatment is based on the type of infection. If an infection is suspected in the pericardial fluid, a procedure called a pericardial fluid culture and biopsy may be done. This takes a sample of the pericardial fluid. The sample is sent to a lab which runs tests on the pericardial fluid to check for an infection.  If the autoimmune disease is the cause, treatment of the autoimmune condition will help improve the pericarditis.  If the cause of pericarditis is not known, anti-inflammatory medicines may be used to help decrease the inflammation.  Surgery may be needed. The following are types of surgeries or procedures that may be done to treat pericarditis:  Pericardial window. A pericardial window makes a cut (incision) into the pericardial sac. This allows excess fluid in the pericardium to drain.  Pericardiocentesis. A pericardiocentesis is also known as a pericardial tap. This procedure uses a needle that is guided by X-ray to drain (aspirate) excess fluid from the pericardium.  Pericardiectomy. A pericardiectomy removes part or all of the pericardium. HOME CARE INSTRUCTIONS   Do not smoke. If you smoke, quit. Your caregiver can help you quit smoking.  Maintain a healthy weight.  Follow an exercise program as told by your caregiver.  If you drink alcohol, do so in moderation.  Eat a heart healthy diet. A registered dietician can help you learn about   healthy food choices.  Keep a list of all your medicines with you at all times. Include the name,  dose, how often it is taken and how it is taken. SEEK IMMEDIATE MEDICAL CARE IF:   You have chest pain or feelings of chest pressure.  You have sweating (diaphoresis) when at rest.  You have irregular heartbeats (palpitations).  You have rapid, racing heart beats.  You have unexplained fainting episodes.  You feel sick to your stomach (nausea) or vomiting without cause.  You have unexplained weakness. If you develop any of the symptoms which originally made you seek care, call for local emergency medical help. Do not drive yourself to the hospital. Document Released: 05/04/2001 Document Revised: 01/31/2012 Document Reviewed: 11/10/2011 [employer] Patient Information 2014 [employer].  

## 2014-04-09 NOTE — Progress Notes (Signed)
CARE MANAGEMENT NOTE 04/09/2014  Patient:  [name] Santana,[name] Santana   Account Number:  [id number]  Date Initiated:  04/09/2014  Documentation initiated by:  DAVIS,RHONDA  Subjective/Objective Assessment:   admitted with chest pain, pericardial effusion, and percarditis, active coccaine use ac chest pain,     Action/Plan:   home when stable   Anticipated DC Date:  04/12/2014   Anticipated DC Plan:  HOME/SELF CARE  In-house referral  Clinical Social Worker  Artistinancial Counselor      DC Planning Services  NA      [facility]   Choice offered to / List presented to:  NA   DME arranged  NA      DME agency  NA     HH arranged  NA      HH agency  NA   Status of service:  In process, will continue to follow Medicare Important Message given?  NA - LOS <3 / Initial given by admissions (If response is "NO", the following Medicare IM given date fields will be blank) Date Medicare IM given:   Date Additional Medicare IM given:    Discharge Disposition:    Per UR Regulation:  Reviewed for med. necessity/level of care/duration of stay  If discussed at Long Length of Stay Meetings, dates discussed:    Comments:  [id number]/Rhonda Stark JockDavis, RN, BSN, [place] [phone] Chart Reviewed for discharge and hospital needs. Discharge needs at time of review: None present will follow for needs. Review of patient progress due on [id number].

## 2014-04-10 DIAGNOSIS — F1410 Cocaine abuse, uncomplicated: Secondary | ICD-10-CM

## 2014-04-10 LAB — BASIC METABOLIC PANEL
BUN: 12 mg/dL (ref 6–23)
CO2: 21 mEq/L (ref 19–32)
CREATININE: 0.77 mg/dL (ref 0.50–1.10)
Calcium: 8.7 mg/dL (ref 8.4–10.5)
Chloride: 111 mEq/L (ref 96–112)
GFR calc Af Amer: >90 mL/min (ref >90)
GFR calc non Af Amer: >90 mL/min (ref >90)
GLUCOSE: 125 mg/dL — AB (ref 70–99)
POTASSIUM: 3.8 meq/L (ref 3.7–5.3)
Sodium: 141 mEq/L (ref 137–147)

## 2014-04-10 LAB — CBC
HEMATOCRIT: 28.6 % — AB (ref 36.0–46.0)
HEMOGLOBIN: 9.1 g/dL — AB (ref 12.0–15.0)
MCH: 29 pg (ref 26.0–34.0)
MCHC: 31.8 g/dL (ref 30.0–36.0)
MCV: 91.1 fL (ref 78.0–100.0)
Platelets: 361 10*3/uL (ref 150–400)
RBC: 3.14 MIL/uL — ABNORMAL LOW (ref 3.87–5.11)
RDW: 14 % (ref 11.5–15.5)
WBC: 16.3 10*3/uL — ABNORMAL HIGH (ref 4.0–10.5)

## 2014-04-10 LAB — C-REACTIVE PROTEIN: CRP: 8.1 mg/dL — ABNORMAL HIGH (ref <0.60)

## 2014-04-10 MED ORDER — IBUPROFEN 800 MG PO TABS
800.0000 mg | ORAL_TABLET | Freq: Three times a day (TID) | ORAL | Status: DC
  Administered 2014-04-10 – 2014-04-11 (×3): 800 mg via ORAL
  Filled 2014-04-10 (×3): qty 1

## 2014-04-10 MED ORDER — PREDNISONE 10 MG PO TABS
10.0000 mg | ORAL_TABLET | Freq: Every day | ORAL | Status: DC
  Administered 2014-04-11: 10 mg via ORAL
  Filled 2014-04-10 (×2): qty 1

## 2014-04-10 MED ORDER — PANTOPRAZOLE SODIUM 40 MG PO TBEC
40.0000 mg | DELAYED_RELEASE_TABLET | Freq: Two times a day (BID) | ORAL | Status: DC
  Administered 2014-04-10 – 2014-04-11 (×2): 40 mg via ORAL
  Filled 2014-04-10 (×3): qty 1

## 2014-04-10 NOTE — Progress Notes (Signed)
DAILY PROGRESS NOTE  Subjective:  Feels better, less chest pain. ST elevation is improved today. Echo yesterday showed EF of 65-70%. There is moderate, diffuse wall thickening. No significant pericardial inflammation or effusion was noted.   Objective:  Temp:  [97.6 F (36.4 C)-98.8 F (37.1 C)] 98.3 F (36.8 C) (05/20 0400) Pulse Rate:  [94-117] 94 (05/20 0600) Resp:  [17-32] 17 (05/20 0600) BP: (85-113)/(49-75) 102/64 mmHg (05/20 0600) SpO2:  [95 %-100 %] 95 % (05/20 0600) Weight:  [179 lb 3.7 oz (81.3 kg)] 179 lb 3.7 oz (81.3 kg) (05/20 0400) Weight change: 5 lb 11.7 oz (2.6 kg)  Intake/Output from previous day: 05/19 0701 - 05/20 0700 In: 1375 [I.V.:1375] Out: 1100 [Urine:1100]  Intake/Output from this shift:    Medications: Current Facility-Administered Medications  Medication Dose Route Frequency Provider Last Rate Last Dose  . aspirin tablet 325 mg  325 mg Oral Daily Thurnell Lose, MD   325 mg at 04/09/14 1100  . colchicine tablet 0.6 mg  0.6 mg Oral BID Neta Ehlers, MD   0.6 mg at 04/09/14 2200  . diphenhydrAMINE (BENADRYL) capsule 25 mg  25 mg Oral Q6H PRN Thurnell Lose, MD      . folic acid (FOLVITE) tablet 1 mg  1 mg Oral Daily Thurnell Lose, MD   1 mg at 04/09/14 1100  . guaiFENesin-dextromethorphan (ROBITUSSIN DM) 100-10 MG/5ML syrup 5 mL  5 mL Oral Q4H PRN Thurnell Lose, MD   5 mL at 04/10/14 0415  . HYDROcodone-acetaminophen (NORCO/VICODIN) 5-325 MG per tablet 1-2 tablet  1-2 tablet Oral Q4H PRN Thurnell Lose, MD   2 tablet at 04/10/14 0416  . ibuprofen (ADVIL,MOTRIN) tablet 800 mg  800 mg Oral Q6H Neta Ehlers, MD   800 mg at 04/10/14 0600  . [START ON 04/13/2014] methotrexate (RHEUMATREX) tablet 20 mg  20 mg Oral Weekly Thurnell Lose, MD      . methylPREDNISolone sodium succinate (SOLU-MEDROL) 125 mg/2 mL injection 60 mg  60 mg Intravenous Daily Thurnell Lose, MD   60 mg at 04/09/14 1100  . multivitamin with minerals  tablet 1 tablet  1 tablet Oral Daily Thurnell Lose, MD   1 tablet at 04/09/14 1000  . ondansetron (ZOFRAN) tablet 4 mg  4 mg Oral Q6H PRN Thurnell Lose, MD       Or  . ondansetron (ZOFRAN) injection 4 mg  4 mg Intravenous Q6H PRN Thurnell Lose, MD      . pantoprazole (PROTONIX) EC tablet 40 mg  40 mg Oral BID Domenic Polite, MD      . polyethylene glycol (MIRALAX / GLYCOLAX) packet 17 g  17 g Oral Daily PRN Thurnell Lose, MD      . sodium chloride 0.9 % bolus 1,000 mL  1,000 mL Intravenous PRN Thurnell Lose, MD   1,000 mL at 04/09/14 2356  . sodium chloride 0.9 % injection 3 mL  3 mL Intravenous Q12H Thurnell Lose, MD   3 mL at 04/09/14 1100    Physical Exam: General appearance: alert and no distress Neck: no carotid bruit and no JVD Lungs: clear to auscultation bilaterally Heart: regular rate and rhythm, S1, S2 normal and friction rub heard multicomponent Abdomen: soft, non-tender; bowel sounds normal; no masses,  no organomegaly Extremities: extremities normal, atraumatic, no cyanosis or edema Pulses: 2+ and symmetric Skin: Skin color, texture, turgor normal. No  rashes or lesions Neurologic: Grossly normal Psych: Pleasant  Lab Results: Results for orders placed during the hospital encounter of 04/08/14 (from the past 48 hour(s))  CBC WITH DIFFERENTIAL     Status: Abnormal   Collection Time    04/08/14  8:10 PM      Result Value Ref Range   WBC 22.0 (*) 4.0 - 10.5 K/uL   RBC 3.83 (*) 3.87 - 5.11 MIL/uL   Hemoglobin 11.6 (*) 12.0 - 15.0 g/dL   HCT 34.6 (*) 36.0 - 46.0 %   MCV 90.3  78.0 - 100.0 fL   MCH 30.3  26.0 - 34.0 pg   MCHC 33.5  30.0 - 36.0 g/dL   RDW 13.8  11.5 - 15.5 %   Platelets 437 (*) 150 - 400 K/uL   Neutrophils Relative % 85 (*) 43 - 77 %   Neutro Abs 18.7 (*) 1.7 - 7.7 K/uL   Lymphocytes Relative 8 (*) 12 - 46 %   Lymphs Abs 1.8  0.7 - 4.0 K/uL   Monocytes Relative 7  3 - 12 %   Monocytes Absolute 1.4 (*) 0.1 - 1.0 K/uL   Eosinophils  Relative 0  0 - 5 %   Eosinophils Absolute 0.0  0.0 - 0.7 K/uL   Basophils Relative 0  0 - 1 %   Basophils Absolute 0.0  0.0 - 0.1 K/uL  BASIC METABOLIC PANEL     Status: Abnormal   Collection Time    04/08/14  8:10 PM      Result Value Ref Range   Sodium 135 (*) 137 - 147 mEq/L   Potassium 4.0  3.7 - 5.3 mEq/L   Chloride 98  96 - 112 mEq/L   CO2 26  19 - 32 mEq/L   Glucose, Bld 131 (*) 70 - 99 mg/dL   BUN 12  6 - 23 mg/dL   Creatinine, Ser 0.99  0.50 - 1.10 mg/dL   Calcium 9.7  8.4 - 10.5 mg/dL   GFR calc non Af Amer 70 (*) >90 mL/min   GFR calc Af Amer 81 (*) >90 mL/min   Comment: (NOTE)     The eGFR has been calculated using the CKD EPI equation.     This calculation has not been validated in all clinical situations.     eGFR's persistently <90 mL/min signify possible Chronic Kidney     Disease.  Randolm Idol, ED     Status: None   Collection Time    04/08/14  8:19 PM      Result Value Ref Range   Troponin i, poc 0.00  0.00 - 0.08 ng/mL   Comment 3            Comment: Due to the release kinetics of cTnI,     a negative result within the first hours     of the onset of symptoms does not rule out     myocardial infarction with certainty.     If myocardial infarction is still suspected,     repeat the test at appropriate intervals.  TROPONIN I     Status: None   Collection Time    04/08/14 10:35 PM      Result Value Ref Range   Troponin I <0.30  <0.30 ng/mL   Comment:            Due to the release kinetics of cTnI,     a negative result within the first hours  of the onset of symptoms does not rule out     myocardial infarction with certainty.     If myocardial infarction is still suspected,     repeat the test at appropriate intervals.  D-DIMER, QUANTITATIVE     Status: Abnormal   Collection Time    04/08/14 10:35 PM      Result Value Ref Range   D-Dimer, Quant 0.86 (*) 0.00 - 0.48 ug/mL-FEU   Comment:            AT THE INHOUSE ESTABLISHED CUTOFF      VALUE OF 0.48 ug/mL FEU,     THIS ASSAY HAS BEEN DOCUMENTED     IN THE LITERATURE TO HAVE     A SENSITIVITY AND NEGATIVE     PREDICTIVE VALUE OF AT LEAST     98 TO 99%.  THE TEST RESULT     SHOULD BE CORRELATED WITH     AN ASSESSMENT OF THE CLINICAL     PROBABILITY OF DVT / VTE.  URINALYSIS, ROUTINE W REFLEX MICROSCOPIC     Status: Abnormal   Collection Time    04/08/14 10:59 PM      Result Value Ref Range   Color, Urine AMBER (*) YELLOW   Comment: BIOCHEMICALS MAY BE AFFECTED BY COLOR   APPearance CLEAR  CLEAR   Specific Gravity, Urine 1.029  1.005 - 1.030   pH 5.5  5.0 - 8.0   Glucose, UA NEGATIVE  NEGATIVE mg/dL   Hgb urine dipstick NEGATIVE  NEGATIVE   Bilirubin Urine SMALL (*) NEGATIVE   Ketones, ur NEGATIVE  NEGATIVE mg/dL   Protein, ur NEGATIVE  NEGATIVE mg/dL   Urobilinogen, UA 1.0  0.0 - 1.0 mg/dL   Nitrite NEGATIVE  NEGATIVE   Leukocytes, UA NEGATIVE  NEGATIVE   Comment: MICROSCOPIC NOT DONE ON URINES WITH NEGATIVE PROTEIN, BLOOD, LEUKOCYTES, NITRITE, OR GLUCOSE <1000 mg/dL.  URINE RAPID DRUG SCREEN (HOSP PERFORMED)     Status: Abnormal   Collection Time    04/08/14 10:59 PM      Result Value Ref Range   Opiates NONE DETECTED  NONE DETECTED   Cocaine POSITIVE (*) NONE DETECTED   Benzodiazepines POSITIVE (*) NONE DETECTED   Amphetamines NONE DETECTED  NONE DETECTED   Tetrahydrocannabinol NONE DETECTED  NONE DETECTED   Barbiturates NONE DETECTED  NONE DETECTED   Comment:            DRUG SCREEN FOR MEDICAL PURPOSES     ONLY.  IF CONFIRMATION IS NEEDED     FOR ANY PURPOSE, NOTIFY LAB     WITHIN 5 DAYS.                LOWEST DETECTABLE LIMITS     FOR URINE DRUG SCREEN     Drug Class       Cutoff (ng/mL)     Amphetamine      1000     Barbiturate      200     Benzodiazepine   539     Tricyclics       767     Opiates          300     Cocaine          300     THC              50  POC URINE PREG, ED     Status: None   Collection Time    04/08/14  11:08 PM       Result Value Ref Range   Preg Test, Ur NEGATIVE  NEGATIVE   Comment:            THE SENSITIVITY OF THIS     METHODOLOGY IS >24 mIU/mL  MRSA PCR SCREENING     Status: None   Collection Time    04/09/14  2:45 AM      Result Value Ref Range   MRSA by PCR NEGATIVE  NEGATIVE   Comment:            The GeneXpert MRSA Assay (FDA     approved for NASAL specimens     only), is one component of a     comprehensive MRSA colonization     surveillance program. It is not     intended to diagnose MRSA     infection nor to guide or     monitor treatment for     MRSA infections.  TROPONIN I     Status: None   Collection Time    04/09/14  4:29 AM      Result Value Ref Range   Troponin I <0.30  <0.30 ng/mL   Comment:            Due to the release kinetics of cTnI,     a negative result within the first hours     of the onset of symptoms does not rule out     myocardial infarction with certainty.     If myocardial infarction is still suspected,     repeat the test at appropriate intervals.  CBC     Status: Abnormal   Collection Time    04/09/14  4:29 AM      Result Value Ref Range   WBC 16.9 (*) 4.0 - 10.5 K/uL   RBC 3.38 (*) 3.87 - 5.11 MIL/uL   Hemoglobin 9.9 (*) 12.0 - 15.0 g/dL   HCT 30.3 (*) 36.0 - 46.0 %   MCV 89.6  78.0 - 100.0 fL   MCH 29.3  26.0 - 34.0 pg   MCHC 32.7  30.0 - 36.0 g/dL   RDW 13.8  11.5 - 15.5 %   Platelets 348  150 - 400 K/uL  COMPREHENSIVE METABOLIC PANEL     Status: Abnormal   Collection Time    04/09/14  4:29 AM      Result Value Ref Range   Sodium 135 (*) 137 - 147 mEq/L   Potassium 4.5  3.7 - 5.3 mEq/L   Chloride 102  96 - 112 mEq/L   CO2 23  19 - 32 mEq/L   Glucose, Bld 141 (*) 70 - 99 mg/dL   BUN 10  6 - 23 mg/dL   Creatinine, Ser 0.73  0.50 - 1.10 mg/dL   Calcium 8.8  8.4 - 10.5 mg/dL   Total Protein 6.2  6.0 - 8.3 g/dL   Albumin 2.5 (*) 3.5 - 5.2 g/dL   AST 11  0 - 37 U/L   ALT 10  0 - 35 U/L   Alkaline Phosphatase 77  39 - 117 U/L   Total  Bilirubin 0.7  0.3 - 1.2 mg/dL   GFR calc non Af Amer >90  >90 mL/min   GFR calc Af Amer >90  >90 mL/min   Comment: (NOTE)     The eGFR has been calculated using the CKD EPI equation.     This calculation has not been validated in all clinical  situations.     eGFR's persistently <90 mL/min signify possible Chronic Kidney     Disease.  TROPONIN I     Status: None   Collection Time    04/09/14 10:51 AM      Result Value Ref Range   Troponin I <0.30  <0.30 ng/mL   Comment:            Due to the release kinetics of cTnI,     a negative result within the first hours     of the onset of symptoms does not rule out     myocardial infarction with certainty.     If myocardial infarction is still suspected,     repeat the test at appropriate intervals.  VITAMIN B12     Status: None   Collection Time    04/09/14  5:45 PM      Result Value Ref Range   Vitamin B-12 469  211 - 911 pg/mL   Comment: Performed at [facility]     Status: None   Collection Time    04/09/14  5:45 PM      Result Value Ref Range   Folate >20.0     Comment: (NOTE)     Reference Ranges            Deficient:       0.4 - 3.3 ng/mL            Indeterminate:   3.4 - 5.4 ng/mL            Normal:              > 5.4 ng/mL     Performed at [facility]  IRON AND TIBC     Status: Abnormal   Collection Time    04/09/14  5:45 PM      Result Value Ref Range   Iron 13 (*) 42 - 135 ug/dL   TIBC 278  250 - 470 ug/dL   Saturation Ratios 5 (*) 20 - 55 %   UIBC 265  125 - 400 ug/dL   Comment: Performed at [facility]     Status: None   Collection Time    04/09/14  5:45 PM      Result Value Ref Range   Ferritin 78  10 - 291 ng/mL   Comment: Performed at [place]     Status: Abnormal   Collection Time    04/09/14  5:45 PM      Result Value Ref Range   Retic Ct Pct 1.2  0.4 - 3.1 %   RBC. 3.30 (*) 3.87 - 5.11 MIL/uL   Retic Count, Manual 39.6  19.0 -  186.0 K/uL  BASIC METABOLIC PANEL     Status: Abnormal   Collection Time    04/10/14  3:41 AM      Result Value Ref Range   Sodium 141  137 - 147 mEq/L   Potassium 3.8  3.7 - 5.3 mEq/L   Chloride 111  96 - 112 mEq/L   Comment: DELTA CHECK NOTED     REPEATED TO VERIFY   CO2 21  19 - 32 mEq/L   Glucose, Bld 125 (*) 70 - 99 mg/dL   BUN 12  6 - 23 mg/dL   Creatinine, Ser 0.77  0.50 - 1.10 mg/dL   Calcium 8.7  8.4 - 10.5 mg/dL   GFR calc non Af Amer >90  >  90 mL/min   GFR calc Af Amer >90  >90 mL/min   Comment: (NOTE)     The eGFR has been calculated using the CKD EPI equation.     This calculation has not been validated in all clinical situations.     eGFR's persistently <90 mL/min signify possible Chronic Kidney     Disease.  CBC     Status: Abnormal   Collection Time    04/10/14  3:41 AM      Result Value Ref Range   WBC 16.3 (*) 4.0 - 10.5 K/uL   RBC 3.14 (*) 3.87 - 5.11 MIL/uL   Hemoglobin 9.1 (*) 12.0 - 15.0 g/dL   HCT 28.6 (*) 36.0 - 46.0 %   MCV 91.1  78.0 - 100.0 fL   MCH 29.0  26.0 - 34.0 pg   MCHC 31.8  30.0 - 36.0 g/dL   RDW 14.0  11.5 - 15.5 %   Platelets 361  150 - 400 K/uL    Imaging: Dg Chest Portable 1 View  04/08/2014   CLINICAL DATA:  Chest pain.  EXAM: PORTABLE CHEST - 1 VIEW  COMPARISON:  Mar 22, 2014.  FINDINGS: The heart size and mediastinal contours are within normal limits. Both lungs are clear. No pneumothorax or pleural effusion is noted. Hypoinflation of the lungs is noted most likely due to poor inspiratory effort. The visualized skeletal structures are unremarkable.  IMPRESSION: No acute cardiopulmonary abnormality seen.   Electronically Signed   By: Sabino Dick M.D.   On: 04/08/2014 21:06    Assessment:  Principal Problem:   ST segment elevation - pericarditis Active Problems:   Pericarditis   Chest pain of pericarditis   Vasculitis   Acute pericarditis   Plan:  1. Continue colchicine BID, full dose aspirin and ibuprofen 800 mg TID.   Would taper doses of NSAIDs slowly over 1 month as an outpatient. Continue colchicine for 3 months. Continue protonix as outpatient for stomach protection. Add CRP for baseline - would like to see that normalize.  No further suggestions at this time. Would be happy to see in follow-up after discharge. Please arrange an appointment with me in about 1 month.  Time Spent Directly with Patient:  15 minutes  Length of Stay:  LOS: 2 days   Pixie Casino, MD, [facility] Attending Cardiologist [place] 04/10/2014, 7:53 AM

## 2014-04-10 NOTE — Progress Notes (Signed)
[facility] PROGRESS NOTE  [name] ConverseStefanie Santana WJX:[id number] DOB: June 21, 1971 DOA: 04/08/2014 PCP: No PCP Per Patient Brief narrative: Patient is a [age] with history of vasculitis unclear type follows with rheumatologist Dr.Beekman and is on prednisone and methotrexate. She presented with one-day complaint of substernal: Constant chest discomfort. EKG in the ED showed diffuse ST elevations and patient was diagnosed with pericarditis. Cardiology subsequently consulted.  Assessment/Plan:  Acute pericarditis - Cardiology on board and managing - Echocardiogram noted, no large effusion noted - continue Colchicine, Ibuprofen, ASA  Vasculitis - stable - resume Prednisone at home dose - Fu with Dr.Beekman  Leukocytosis - most likely due to recent Steroid use  Anemia - No active bleeding currently - Anemia panel`c/w iron defi  Code Status: full Family Communication: Discussed directly with patient Disposition Plan: Tx to floor   Consultants:  Cardiology  Procedures:  Echocardiogram pending  Antibiotics:  None  HPI/Subjective: Mild chest pain with cough only  Objective: Filed Vitals:   04/10/14 0929  BP: 114/74  Pulse: 98  Temp: 97.9 F (36.6 C)  Resp: 16    Intake/Output Summary (Last 24 hours) at 04/10/14 1128 Last data filed at 04/10/14 0515  Gross per 24 hour  Intake    875 ml  Output   1100 ml  Net   -225 ml   Filed Weights   04/09/14 0245 04/10/14 0400  Weight: 78.7 kg (173 lb 8 oz) 81.3 kg (179 lb 3.7 oz)    Exam:   General:  Pt in NAD, alert and awake  Cardiovascular: RRR, no Murmurs  Respiratory: CTA BL, no wheezes, no rhales  Abdomen: soft, NT, ND  Musculoskeletal: no cyanosis or clubbing   Data Reviewed: Basic Metabolic Panel:  Recent Labs Lab 04/08/14 2010 04/09/14 0429 04/10/14 0341  NA 135* 135* 141  K 4.0 4.5 3.8  CL 98 102 111  CO2 26 23 21   GLUCOSE 131* 141* 125*  BUN 12 10 12   CREATININE 0.99 0.73 0.77  CALCIUM  9.7 8.8 8.7   Liver Function Tests:  Recent Labs Lab 04/09/14 0429  AST 11  ALT 10  ALKPHOS 77  BILITOT 0.7  PROT 6.2  ALBUMIN 2.5*   No results found for this basename: LIPASE, AMYLASE,  in the last 168 hours No results found for this basename: AMMONIA,  in the last 168 hours CBC:  Recent Labs Lab 04/08/14 2010 04/09/14 0429 04/10/14 0341  WBC 22.0* 16.9* 16.3*  NEUTROABS 18.7*  --   --   HGB 11.6* 9.9* 9.1*  HCT 34.6* 30.3* 28.6*  MCV 90.3 89.6 91.1  PLT 437* 348 361   Cardiac Enzymes:  Recent Labs Lab 04/08/14 2235 04/09/14 0429 04/09/14 1051  TROPONINI <0.30 <0.30 <0.30   BNP (last 3 results) No results found for this basename: PROBNP,  in the last 8760 hours CBG: No results found for this basename: GLUCAP,  in the last 168 hours  Recent Results (from the past 240 hour(s))  MRSA PCR SCREENING     Status: None   Collection Time    04/09/14  2:45 AM      Result Value Ref Range Status   MRSA by PCR NEGATIVE  NEGATIVE Final   Comment:            The GeneXpert MRSA Assay (FDA     approved for NASAL specimens     only), is one component of a     comprehensive MRSA colonization     surveillance program. It  is not     intended to diagnose MRSA     infection nor to guide or     monitor treatment for     MRSA infections.     Studies: Dg Chest Portable 1 View  04/08/2014   CLINICAL DATA:  Chest pain.  EXAM: PORTABLE CHEST - 1 VIEW  COMPARISON:  Mar 22, 2014.  FINDINGS: The heart size and mediastinal contours are within normal limits. Both lungs are clear. No pneumothorax or pleural effusion is noted. Hypoinflation of the lungs is noted most likely due to poor inspiratory effort. The visualized skeletal structures are unremarkable.  IMPRESSION: No acute cardiopulmonary abnormality seen.   Electronically Signed   By: Roque LiasJames  Green M.D.   On: 04/08/2014 21:06    Scheduled Meds: . aspirin  325 mg Oral Daily  . colchicine  0.6 mg Oral BID  . folic acid  1 mg  Oral Daily  . ibuprofen  800 mg Oral Q6H  . [START ON 04/13/2014] methotrexate  20 mg Oral Weekly  . methylPREDNISolone (SOLU-MEDROL) injection  60 mg Intravenous Daily  . multivitamin with minerals  1 tablet Oral Daily  . pantoprazole  40 mg Oral BID  . sodium chloride  3 mL Intravenous Q12H   Continuous Infusions:     Time spent: > 35 minutes    [name] CovePreetha [name] Santana  [facility] Pager [phone]. If 7PM-7AM, please contact night-coverage at [email or web address], password [facility] 04/10/2014, 11:28 AM  LOS: 2 days

## 2014-04-10 NOTE — Progress Notes (Signed)
Called report to [place] on 4E. Will give meds and transfer.

## 2014-04-10 NOTE — Progress Notes (Signed)
Report received from K. Phillips,RN. No change in assesesment. Will continue plan of care. Yancey Flemingsana Johnson Drayden Lukas

## 2014-04-11 MED ORDER — ASPIRIN EC 81 MG PO TBEC: 81.0000 mg | DELAYED_RELEASE_TABLET | Freq: Every day | ORAL | Status: AC

## 2014-04-11 MED ORDER — COLCHICINE 0.6 MG PO TABS: 0.6000 mg | ORAL_TABLET | Freq: Two times a day (BID) | ORAL | Status: DC

## 2014-04-11 MED ORDER — PANTOPRAZOLE SODIUM 40 MG PO TBEC: 40.0000 mg | DELAYED_RELEASE_TABLET | Freq: Two times a day (BID) | ORAL | Status: DC

## 2014-04-11 MED ORDER — IBUPROFEN 800 MG PO TABS: 800.0000 mg | ORAL_TABLET | Freq: Three times a day (TID) | ORAL | Status: DC

## 2014-04-11 NOTE — Care Management Note (Signed)
    Page 1 of 2   04/11/2014     11:37:40 AM CARE MANAGEMENT NOTE 04/11/2014  Patient:  [name] Santana,[name]   Account Number:  [id number]  Date Initiated:  04/09/2014  Documentation initiated by:  DAVIS,RHONDA  Subjective/Objective Assessment:   admitted with chest pain, pericardial effusion, and percarditis, active coccaine use ac chest pain,     Action/Plan:   home when stable   Anticipated DC Date:  04/11/2014   Anticipated DC Plan:  HOME/SELF CARE  In-house referral  Clinical Social Worker  Artistinancial Counselor      DC Planning Services  NA      [facility]  NA   Choice offered to / List presented to:  NA   DME arranged  NA      DME agency  NA     HH arranged  NA      HH agency  NA   Status of service:  Completed, signed off Medicare Important Message given?  NA - LOS <3 / Initial given by admissions (If response is "NO", the following Medicare IM given date fields will be blank) Date Medicare IM given:   Date Additional Medicare IM given:    Discharge Disposition:  HOME/SELF CARE  Per UR Regulation:  Reviewed for med. necessity/level of care/duration of stay  If discussed at Long Length of Stay Meetings, dates discussed:    Comments:  04/11/14 [facility] Quintavious Rinck RN,BSN NCM [phone] D/C HOME.  [id number]/Rhonda Earlene Plateravis, RN, BSN, [place]CCM [phone] Chart Reviewed for discharge and hospital needs. Discharge needs at time of review: None present will follow for needs. Review of patient progress due on [id number].

## 2014-05-02 ENCOUNTER — Ambulatory Visit: Payer: BC Managed Care – PPO | Admitting: Cardiology

## 2014-05-07 NOTE — Discharge Summary (Signed)
Physician Discharge Summary  [name] ConverseStefanie Santana QMV:[id number] DOB: 03/04/1971 DOA: 04/08/2014  PCP: No PCP Per Patient  Admit date: 04/08/2014 Discharge date: 04/11/2014  Time spent: 45 minutes  Recommendations for Outpatient Follow-up:  1. Cards, Corine ShelterLuke Kilroy 6/11 2. Dr.Beekman in 2-3weeks  Discharge Diagnoses:  Principal Problem:   ST segment elevation - pericarditis Active Problems:   Pericarditis   Chest pain of pericarditis   Vasculitis   Acute pericarditis   Discharge Condition: stable  Diet recommendation: regular  Filed Weights   04/09/14 0245 04/10/14 0400 04/11/14 0524  Weight: 78.7 kg (173 lb 8 oz) 81.3 kg (179 lb 3.7 oz) 82.283 kg (181 lb 6.4 oz)    History of present illness:   [name] Santana is a 43 y.o. female, with history of vasculitis unclear type follows with a rheumatologist Dr. Dierdre ForthBeekman and is on prednisone and methotrexate, arthritis, cocaine abuse last used yesterday, comes to the hospital with a one-day history of substernal dull constant chest pain which is nonradiating associated with mild shortness of breath, worse by movement and leaning back, somewhat better with rest and leaning forwards, no cough, no fever chills lately, denies any diarrhea, no blood in stool or urine, no recent long drives or travels, no personal history of blood clots, came to the ER where she was found to be slightly hypotensive, EKG showed diffuse ST elevation suggestive of pericarditis, cardiologist Dr. Excell Seltzerooper was consulted over the phone who requested stepdown admission by medicine in consultation by cardiology  Hospital Course:  Patient is a 89 with history of vasculitis unclear type follows with rheumatologist Dr.Beekman and is on prednisone and methotrexate. She presented with one-day complaint of substernal: Constant chest discomfort. EKG in the ED showed diffuse ST elevations and patient was diagnosed with pericarditis. Cardiology subsequently consulted.   Acute  pericarditis  - Cardiology following - Echocardiogram noted, no large effusion noted  - started on Colchicine, Ibuprofen, ASA  - discharged on course of Ibuprofen and Colchicine per cards recommendation - FU with Cards  Vasculitis  - stable  - resumed Prednisone at home dose  - Fu with Dr.Beekman   Leukocytosis  - most likely due to recent Steroid use   Anemia  - No active bleeding currently  - Anemia panel`c/w iron defi   Consultations:  cardiology  Discharge Exam: Filed Vitals:   04/11/14 0524  BP: 118/72  Pulse: 89  Temp: 98.5 F (36.9 C)  Resp: 20    General: AAOx3 Cardiovascular: S1S2/RRR Respiratory: CTAB  Discharge Instructions You were cared for by a hospitalist during your hospital stay. If you have any questions about your discharge medications or the care you received while you were in the hospital after you are discharged, you can call the unit and asked to speak with the hospitalist on call if the hospitalist that took care of you is not available. Once you are discharged, your primary care physician will handle any further medical issues. Please note that NO REFILLS for any discharge medications will be authorized once you are discharged, as it is imperative that you return to your primary care physician (or establish a relationship with a primary care physician if you do not have one) for your aftercare needs so that they can reassess your need for medications and monitor your lab values.  Discharge Instructions   Diet - low sodium heart healthy    Complete by:  As directed      Increase activity slowly    Complete by:  As  directed             Medication List         aspirin EC 81 MG tablet  Take 1 tablet (81 mg total) by mouth daily.     colchicine 0.6 MG tablet  Take 1 tablet (0.6 mg total) by mouth 2 (two) times daily.     folic acid 1 MG tablet  Commonly known as:  FOLVITE  Take 1 mg by mouth daily.     ibuprofen 800 MG tablet   Commonly known as:  ADVIL,MOTRIN  Take 1 tablet (800 mg total) by mouth 3 (three) times daily. 800mg  TID for 1 week then 400mg  TID for 2 weeks then 200mg  TID for 1 week     methotrexate 2.5 MG tablet  Commonly known as:  RHEUMATREX  Take 20 mg by mouth once a week. Caution:Chemotherapy. Protect from light.     multivitamin with minerals Tabs tablet  Take 1 tablet by mouth daily.     pantoprazole 40 MG tablet  Commonly known as:  PROTONIX  Take 1 tablet (40 mg total) by mouth 2 (two) times daily.     predniSONE 10 MG tablet  Commonly known as:  DELTASONE  Take 10 mg by mouth daily with breakfast.       No Known Allergies     Follow-up Information   Follow up with KILROY,LUKE K, PA-C. Schedule an appointment as soon as possible for a visit on 05/02/2014. (appointment time 2:30pm)    Specialty:  Cardiology   Contact information:   [address] [place] [address] [phone]        The results of significant diagnostics from this hospitalization (including imaging, microbiology, ancillary and laboratory) are listed below for reference.    Significant Diagnostic Studies: Dg Chest Portable 1 View  04/08/2014   CLINICAL DATA:  Chest pain.  EXAM: PORTABLE CHEST - 1 VIEW  COMPARISON:  Mar 22, 2014.  FINDINGS: The heart size and mediastinal contours are within normal limits. Both lungs are clear. No pneumothorax or pleural effusion is noted. Hypoinflation of the lungs is noted most likely due to poor inspiratory effort. The visualized skeletal structures are unremarkable.  IMPRESSION: No acute cardiopulmonary abnormality seen.   Electronically Signed   By: Roque LiasJames  Green M.D.   On: 04/08/2014 21:06    Microbiology: No results found for this or any previous visit (from the past 240 hour(s)).   Labs: Basic Metabolic Panel: No results found for this basename: NA, K, CL, CO2, GLUCOSE, BUN, CREATININE, CALCIUM, MG, PHOS,  in the last 168 hours Liver Function  Tests: No results found for this basename: AST, ALT, ALKPHOS, BILITOT, PROT, ALBUMIN,  in the last 168 hours No results found for this basename: LIPASE, AMYLASE,  in the last 168 hours No results found for this basename: AMMONIA,  in the last 168 hours CBC: No results found for this basename: WBC, NEUTROABS, HGB, HCT, MCV, PLT,  in the last 168 hours Cardiac Enzymes: No results found for this basename: CKTOTAL, CKMB, CKMBINDEX, TROPONINI,  in the last 168 hours BNP: BNP (last 3 results) No results found for this basename: PROBNP,  in the last 8760 hours CBG: No results found for this basename: GLUCAP,  in the last 168 hours     Signed:  Azell Bill  [facility] 05/07/2014, 11:49 AM

## 2014-05-08 ENCOUNTER — Encounter: Payer: Self-pay | Admitting: Cardiology

## 2014-05-08 ENCOUNTER — Telehealth: Payer: Self-pay | Admitting: Cardiology

## 2014-05-10 NOTE — Telephone Encounter (Signed)
Closed encounter °

## 2014-05-15 ENCOUNTER — Ambulatory Visit: Payer: BC Managed Care – PPO | Admitting: Cardiology

## 2014-05-20 ENCOUNTER — Ambulatory Visit (INDEPENDENT_AMBULATORY_CARE_PROVIDER_SITE_OTHER): Payer: BC Managed Care – PPO | Admitting: Cardiology

## 2014-05-20 ENCOUNTER — Encounter: Payer: Self-pay | Admitting: Cardiology

## 2014-05-20 VITALS — BP 112/68 | Ht 68.0 in | Wt 165.0 lb

## 2014-05-20 DIAGNOSIS — I319 Disease of pericardium, unspecified: Secondary | ICD-10-CM

## 2014-05-20 DIAGNOSIS — I776 Arteritis, unspecified: Secondary | ICD-10-CM

## 2014-05-20 MED ORDER — PANTOPRAZOLE SODIUM 40 MG PO TBEC: 40.0000 mg | DELAYED_RELEASE_TABLET | Freq: Two times a day (BID) | ORAL | Status: DC

## 2014-05-20 MED ORDER — COLCHICINE 0.6 MG PO TABS: 0.6000 mg | ORAL_TABLET | Freq: Two times a day (BID) | ORAL | Status: DC

## 2014-05-20 NOTE — Patient Instructions (Signed)
1. Your physician recommends that you schedule a follow-up appointment in: 4-5- weeks with Dr. Rennis GoldenHilty  2. Have your lab work done and we call you with the results  3. Follow up with [facility]  4.Continue colchcine for 3 months

## 2014-05-20 NOTE — Assessment & Plan Note (Signed)
Patient with history of vasculitis followed by Dr. Dierdre ForthBeekman

## 2014-05-20 NOTE — Progress Notes (Signed)
05/20/2014   PCP: No PCP Per Patient   Chief Complaint  Patient presents with  . Follow-up    still having some CP OFF and on    Primary Cardiologist:Dr. Ellene RouteK. Hilty   HPI:  Weight 43-year-old female with a history of vasculitis followed by Dr. Dierdre ForthBeekman is on prednisone and methotrexate chronically. History of cocaine abuse presented to the hospital for hospitalization 04/09/1999 1530 04/11/2014 test elevation diffusely suggestive pericarditis she was admitted with acute pericarditis. She was started on colchicine, ibuprofen and aspirin.  Her echo EF was 65-70% with normal wall motion, the left atrium was mildly dilated.  The pericardium was normal in appearance and there was no pericardial effusion.  She is here today for followup she has missed a few doses of colchicine and is now off ibuprofen. She is starting to have some more chest discomfort-worse when lying down. I have asked her to resume ibuprofen on a daily basis for now.   No Known Allergies  Current Outpatient Prescriptions  Medication Sig Dispense Refill  . aspirin EC 81 MG tablet Take 1 tablet (81 mg total) by mouth daily.      . cholecalciferol (VITAMIN D) 1000 UNITS tablet Take 1,000 Units by mouth daily.      . Cinnamon 500 MG capsule Take 500 mg by mouth daily.      . colchicine 0.6 MG tablet Take 1 tablet (0.6 mg total) by mouth 2 (two) times daily.  60 tablet  2  . folic acid (FOLVITE) 1 MG tablet Take 1 mg by mouth daily.      . Garlic 100 MG TABS Take by mouth.      Marland Kitchen. ibuprofen (ADVIL,MOTRIN) 800 MG tablet Take 1 tablet (800 mg total) by mouth 3 (three) times daily. 800mg  TID for 1 week then 400mg  TID for 2 weeks then 200mg  TID for 1 week  60 tablet  1  . methotrexate (RHEUMATREX) 2.5 MG tablet Take 1.6 mg by mouth once a week.      . Multiple Vitamin (MULTIVITAMIN WITH MINERALS) TABS Take 1 tablet by mouth daily.      . pantoprazole (PROTONIX) 40 MG tablet Take 1 tablet (40 mg total) by mouth 2 (two)  times daily.  60 tablet  6  . predniSONE (DELTASONE) 10 MG tablet Take 10 mg by mouth daily with breakfast.       No current facility-administered medications for this visit.    Past Medical History  Diagnosis Date  . Vasculitis   . Arthritis     Past Surgical History  Procedure Laterality Date  . Varicose vein surgery      ZOX:WRUEAVW:UJROS:General:no colds or fevers, no weight changes Skin:no rashes or ulcers HEENT:no blurred vision, no congestion CV:see HPI PUL:see HPI GI:occ diarrhea, no constipation or melena, no indigestion, occasional nausea this past weekend but that has resolved.  GU:no hematuria, no dysuria MS:no joint pain, no claudication Neuro:no syncope, no lightheadedness Endo:no diabetes, no thyroid disease Gyn:  Last menstrual period June 15 to June 22 a regular clots and prolonged bleeding a masters followed with her primary care doctor [facility] for further evaluation.  Wt Readings from Last 3 Encounters:  05/20/14 165 lb (74.844 kg)  04/11/14 181 lb 6.4 oz (82.283 kg)  12/19/13 160 lb (72.576 kg)    PHYSICAL EXAM BP 112/68  Ht 5\' 8"  (1.727 m)  Wt 165 lb (74.844 kg)  BMI 25.09 kg/m2 General:Pleasant affect, NAD  Skin:Warm and dry, brisk capillary refill HEENT:normocephalic, sclera clear, mucus membranes moist Neck:supple, no JVD, no bruits  Heart:S1S2 RRR without murmur, gallup, no rub or click Lungs:clear without rales, rhonchi, or wheezes ZOX:WRUEAbd:soft, non tender, + BS, do not palpate liver spleen or masses Ext:no lower ext edema, 2+ pedal pulses, 2+ radial pulses Neuro:alert and oriented, MAE, follows commands, + facial symmetry  EKG:ST with continued ST elevation consistent with pericarditis.  ASSESSMENT AND PLAN Pericarditis Overall improved pericarditis though this week and started having some more chest discomfort.  Heart rate is up somewhat today but we does have pharyngeal and she had what down the steps and then come back up to the room. She is  missed a few days of colchicine and she is not taking her ibuprofen Waynard Reedsvester resume it for now until the chest discomfort is resolved. I will have her follow with Dr. Rennis GoldenHilty in 4-5 weeks.  Vasculitis Patient with history of vasculitis followed by Dr. Dierdre ForthBeekman

## 2014-05-20 NOTE — Assessment & Plan Note (Signed)
Overall improved pericarditis though this week and started having some more chest discomfort.  Heart rate is up somewhat today but we does have pharyngeal and she had what down the steps and then come back up to the room. She is missed a few days of colchicine and she is not taking her ibuprofen Waynard Reedsvester resume it for now until the chest discomfort is resolved. I will have her follow with Dr. Rennis GoldenHilty in 4-5 weeks.

## 2014-05-21 LAB — C-REACTIVE PROTEIN: CRP: 14.8 mg/dL — ABNORMAL HIGH (ref <0.60)

## 2014-05-22 NOTE — Progress Notes (Signed)
Pt. Called and message left on voice mail

## 2014-06-25 ENCOUNTER — Ambulatory Visit: Payer: BC Managed Care – PPO | Admitting: Internal Medicine

## 2014-06-28 ENCOUNTER — Telehealth: Payer: Self-pay | Admitting: Internal Medicine

## 2014-06-28 NOTE — Telephone Encounter (Signed)
Closed encounter °

## 2014-08-12 ENCOUNTER — Encounter: Payer: Self-pay | Admitting: Internal Medicine

## 2014-08-12 ENCOUNTER — Ambulatory Visit (INDEPENDENT_AMBULATORY_CARE_PROVIDER_SITE_OTHER): Payer: BC Managed Care – PPO | Admitting: Internal Medicine

## 2014-08-12 VITALS — BP 122/82 | HR 86 | Ht 67.5 in | Wt 176.4 lb

## 2014-08-12 DIAGNOSIS — I776 Arteritis, unspecified: Secondary | ICD-10-CM

## 2014-08-12 DIAGNOSIS — I319 Disease of pericardium, unspecified: Secondary | ICD-10-CM

## 2014-08-12 NOTE — Patient Instructions (Signed)
Return as needed

## 2014-08-13 ENCOUNTER — Encounter: Payer: Self-pay | Admitting: Internal Medicine

## 2014-08-13 NOTE — Progress Notes (Signed)
08/13/2014   PCP: No PCP Per Patient   Chief Complaint  Patient presents with  . Follow-up    saw Vernona Rieger in June; shortness of breath on exertion, dizzy episodes, chest tightness - Vernona Rieger Rx'ed colchicine 0.6mg  to take for 3 months (took 1 month) - was on pantoprazole  BID (not currently taking)     Primary Cardiologist:Dr. Ellene Route   HPI:  Weight 43-year-old female with a history of vasculitis followed by Dr. Dierdre Forth is on prednisone and methotrexate chronically. History of cocaine abuse presented to the hospital for hospitalization 04/09/1999 1530 04/11/2014 test elevation diffusely suggestive pericarditis she was admitted with acute pericarditis. She was started on colchicine, ibuprofen and aspirin.  Her echo EF was 65-70% with normal wall motion, the left atrium was mildly dilated.  The pericardium was normal in appearance and there was no pericardial effusion.  Ms. Hyder returns today for followup for pericarditis. She does have a history of vasculitis and is on prednisone and methotrexate. She seemed to respond to colchicine, ibuprofen and aspirin during her recent hospitalization. She did have some recurrent chest discomfort and was treated with an extended course of colchicine which she completed in August. She reports she had not had recurrence of her pericarditic chest pain.  Her EKG shows resolution of diffuse ST elevation.   No Known Allergies  Current Outpatient Prescriptions  Medication Sig Dispense Refill  . aspirin EC 81 MG tablet Take 1 tablet (81 mg total) by mouth daily.      . cholecalciferol (VITAMIN D) 1000 UNITS tablet Take 1,000 Units by mouth daily.      . Cinnamon 500 MG capsule Take 500 mg by mouth daily.      . DiphenhydrAMINE HCl (BENADRYL PO) Take by mouth.      . folic acid (FOLVITE) 1 MG tablet Take 1 mg by mouth daily.      . Garlic 100 MG TABS Take by mouth.      Marland Kitchen ibuprofen (ADVIL,MOTRIN) 800 MG tablet Take 1 tablet (800 mg total)  by mouth 3 (three) times daily.  TID for 1 week then  TID for 2 weeks then  TID for 1 week  60 tablet  1  . methotrexate (RHEUMATREX) 2.5 MG tablet Take 1.6 mg by mouth once a week.      . Multiple Vitamin (MULTIVITAMIN WITH MINERALS) TABS Take 1 tablet by mouth daily.      . Multiple Vitamins-Minerals (ZINC PO) Take by mouth daily.      . predniSONE (DELTASONE) 10 MG tablet Take 10 mg by mouth daily with breakfast.       No current facility-administered medications for this visit.    Past Medical History  Diagnosis Date  . Vasculitis   . Arthritis     Past Surgical History  Procedure Laterality Date  . Varicose vein surgery      ZOX:WRUEAVW:UJ colds or fevers, no weight changes Skin:no rashes or ulcers HEENT:no blurred vision, no congestion CV:see HPI PUL:see HPI GI:occ diarrhea, no constipation or melena, no indigestion, occasional nausea this past weekend but that has resolved.  GU:no hematuria, no dysuria MS:no joint pain, no claudication Neuro:no syncope, no lightheadedness Endo:no diabetes, no thyroid disease Gyn:  Last menstrual period June 15 to June 22 a regular clots and prolonged bleeding a masters followed with her primary care doctor [facility] for further evaluation.  Wt Readings from Last 3 Encounters:  08/12/14 176 lb 6.4  oz (80.015 kg)  05/20/14 165 lb (74.844 kg)  04/11/14 181 lb 6.4 oz (82.283 kg)    PHYSICAL EXAM BP 122/82  Pulse 86  Ht 5' 7.5" (1.715 m)  Wt 176 lb 6.4 oz (80.015 kg)  BMI 27.20 kg/m2  LMP 07/23/2014 Deferred  EKG: NSR with resolution of ST elevation.  ASSESSMENT Patient Active Problem List   Diagnosis Date Noted  . Pericarditis 04/08/2014  . Chest pain of pericarditis 04/08/2014  . ST segment elevation - pericarditis 04/08/2014  . Acute pericarditis 04/08/2014  . Vasculitis    PLAN Mrs. Reveles has had resolution of her pericarditis. At this point I would not recommend any further treatment with  call to same. She will remain on methotrexate and prednisone for her vasculitis as managed by Dr. Dierdre Forth.  If she has any further episodes of pericarditis in the future she seemed to respond well to call to see and I would not hesitate to use that as needed. Followup with me can be on an as-needed basis.  Chrystie Nose, MD, [facility] Attending Cardiologist [facility]

## 2015-02-14 ENCOUNTER — Telehealth: Payer: Self-pay | Admitting: Physician Assistant

## 2015-02-14 NOTE — Telephone Encounter (Signed)
Apparently staff msg was sent on this pt, I have not seen.   Spoke w/ patient who spoke w/ Bjorn LoserRhonda this morning, called back bc she had not heard from our office.   She was having reproducible chest wall pain this AM. This is now resolved. She stated pain was worse w/ cough, laughter, and rubbing her chest.  She states no other discomfort, no fatigue or dyspnea.  Pt concerned d/t pericarditis hx. She also notes 2 days ago, neck, back, & shoulder pain.  Advised probably not heart issue - would discuss w/ Rhonda to see what options we have & how soon rec'd to be seen. Pt voiced understanding.

## 2015-02-14 NOTE — Telephone Encounter (Signed)
Patient called saying she was having symptoms similar to her previous pericarditis symptoms. They were not bad enough that she fell she had to go to the emergency room, one a no she could be seen today.  I sent a staff message to the office requesting that she be seen urgently today. Advised them that if she could not be seen today, to contact me. I will then call in a prescription for colchicine and try to get her an appointment on Monday. Ms. [name] Santana was in agreement with this is a plan of care.  Theodore Demarkhonda Barrett, PA-C 02/14/2015 8:05 AM Beeper [phone]

## 2015-02-14 NOTE — Telephone Encounter (Addendum)
Re pt hx of pericarditis   Spoke w/ Bjorn Loserhonda - she advised several options. Could call in rx for colchicine 0.6mg  BID #60 0 refills, do ECG today or early next week to visualize for possible pericarditis Also can see in clinic next week on flex calendar. Bjorn LoserRhonda outlined that we could give pt options w/ best information & see what she wants to do.  After phone conversation w/ Bjorn Loserhonda, I called patient.  Pt feeling better now - aware she has option for colcichine but also aware that it may or may not have noticeable effect as she is already on methotrexate & prednisone. She will wait on starting this med for now.  I advised pt I could do EKG today & confer w/ physician here - she wants to wait & see MLP next week.  Pt would prefer a Tuesday appt if possible.  Advised pt I would defer to scheduling to see when/if we can put on flex.

## 2015-02-14 NOTE — Telephone Encounter (Signed)
Paged [name] Santana for callback

## 2015-02-17 ENCOUNTER — Other Ambulatory Visit: Payer: Self-pay

## 2015-02-17 NOTE — Progress Notes (Signed)
This encounter was created in error - please disregard.

## 2015-02-18 ENCOUNTER — Encounter: Payer: Self-pay | Admitting: Physician Assistant

## 2015-02-18 ENCOUNTER — Other Ambulatory Visit: Payer: Self-pay

## 2016-02-12 ENCOUNTER — Other Ambulatory Visit: Payer: Self-pay

## 2016-02-12 DIAGNOSIS — Z1231 Encounter for screening mammogram for malignant neoplasm of breast: Secondary | ICD-10-CM

## 2016-02-14 ENCOUNTER — Encounter (HOSPITAL_COMMUNITY): Payer: Self-pay

## 2016-02-14 ENCOUNTER — Emergency Department (HOSPITAL_COMMUNITY): Payer: Commercial Managed Care - HMO

## 2016-02-14 ENCOUNTER — Emergency Department (HOSPITAL_COMMUNITY)
Admission: EM | Admit: 2016-02-14 | Discharge: 2016-02-14 | Disposition: A | Discharge status: Home / Self Care | Payer: Commercial Managed Care - HMO | Attending: Emergency Medicine | Admitting: Emergency Medicine

## 2016-02-14 DIAGNOSIS — Z7982 Long term (current) use of aspirin: Secondary | ICD-10-CM | POA: Insufficient documentation

## 2016-02-14 DIAGNOSIS — M1990 Unspecified osteoarthritis, unspecified site: Secondary | ICD-10-CM | POA: Insufficient documentation

## 2016-02-14 DIAGNOSIS — Z8679 Personal history of other diseases of the circulatory system: Secondary | ICD-10-CM | POA: Diagnosis not present

## 2016-02-14 DIAGNOSIS — J069 Acute upper respiratory infection, unspecified: Secondary | ICD-10-CM | POA: Insufficient documentation

## 2016-02-14 DIAGNOSIS — Z7952 Long term (current) use of systemic steroids: Secondary | ICD-10-CM | POA: Diagnosis not present

## 2016-02-14 DIAGNOSIS — Z79899 Other long term (current) drug therapy: Secondary | ICD-10-CM | POA: Diagnosis not present

## 2016-02-14 DIAGNOSIS — R0789 Other chest pain: Secondary | ICD-10-CM

## 2016-02-14 DIAGNOSIS — R05 Cough: Secondary | ICD-10-CM

## 2016-02-14 DIAGNOSIS — R059 Cough, unspecified: Secondary | ICD-10-CM

## 2016-02-14 HISTORY — DX: Disease of pericardium, unspecified: I31.9

## 2016-02-14 LAB — I-STAT TROPONIN, ED: Troponin i, poc: 0.03 ng/mL (ref 0.00–0.08)

## 2016-02-14 MED ORDER — IBUPROFEN 200 MG PO TABS
400.0000 mg | ORAL_TABLET | Freq: Once | ORAL | Status: AC
  Administered 2016-02-14: 400 mg via ORAL
  Filled 2016-02-14: qty 2

## 2016-02-14 NOTE — ED Notes (Signed)
Discharge instructions and follow up care reviewed with patient. Patient verbalized understanding. 

## 2016-02-14 NOTE — ED Notes (Signed)
MD at bedside. 

## 2016-02-14 NOTE — Discharge Instructions (Signed)
It was our pleasure to provide your ER care today - we hope that you feel better.  Your symptoms/exam appear most consistent with a viral upper respiratory illness which should run its course and get better in the next few days.   Rest. Drink adequate fluids.  Take acetaminophen and/or ibuprofen as need for pain.  You may try mucinex as need for cough/congestion.  You may use throat lozenges as need for symptom relief.  Follow up with primary care doctor in the coming week if symptoms fail to improve/resolve.  Return to ER if worse, new symptoms, trouble breathing, recurrent/persistent chest pain, other concern.    Cough, Adult Coughing is a reflex that clears your throat and your airways. Coughing helps to heal and protect your lungs. It is normal to cough occasionally, but a cough that happens with other symptoms or lasts a long time may be a sign of a condition that needs treatment. A cough may last only 2-3 weeks (acute), or it may last longer than 8 weeks (chronic). CAUSES Coughing is commonly caused by:  Breathing in substances that irritate your lungs.  A viral or bacterial respiratory infection.  Allergies.  Asthma.  Postnasal drip.  Smoking.  Acid backing up from the stomach into the esophagus (gastroesophageal reflux).  Certain medicines.  Chronic lung problems, including COPD (or rarely, lung cancer).  Other medical conditions such as heart failure. HOME CARE INSTRUCTIONS  Pay attention to any changes in your symptoms. Take these actions to help with your discomfort:  Take medicines only as told by your health care provider.  If you were prescribed an antibiotic medicine, take it as told by your health care provider. Do not stop taking the antibiotic even if you start to feel better.  Talk with your health care provider before you take a cough suppressant medicine.  Drink enough fluid to keep your urine clear or pale yellow.  If the air is dry, use a  cold steam vaporizer or humidifier in your bedroom or your home to help loosen secretions.  Avoid anything that causes you to cough at work or at home.  If your cough is worse at night, try sleeping in a semi-upright position.  Avoid cigarette smoke. If you smoke, quit smoking. If you need help quitting, ask your health care provider.  Avoid caffeine.  Avoid alcohol.  Rest as needed. SEEK MEDICAL CARE IF:   You have new symptoms.  You cough up pus.  Your cough does not get better after 2-3 weeks, or your cough gets worse.  You cannot control your cough with suppressant medicines and you are losing sleep.  You develop pain that is getting worse or pain that is not controlled with pain medicines.  You have a fever.  You have unexplained weight loss.  You have night sweats. SEEK IMMEDIATE MEDICAL CARE IF:  You cough up blood.  You have difficulty breathing.  Your heartbeat is very fast.   This information is not intended to replace advice given to you by your health care provider. Make sure you discuss any questions you have with your health care provider.   Document Released: 05/07/2011 Document Revised: 07/30/2015 Document Reviewed: 01/15/2015 Elsevier Interactive Patient Education 2016 Else[employer] Inc[employer]    Upper Respiratory Infection, Adult Most upper respiratory infections (URIs) are a viral infection of the air passages leading to the lungs. A URI affects the nose, throat, and upper air passages. The most common type of URI is nasopharyngitis and is typically  referred to as "the common cold." URIs run their course and usually go away on their own. Most of the time, a URI does not require medical attention, but sometimes a bacterial infection in the upper airways can follow a viral infection. This is called a secondary infection. Sinus and middle ear infections are common types of secondary upper respiratory infections. Bacterial pneumonia can also complicate a URI. A  URI can worsen asthma and chronic obstructive pulmonary disease (COPD). Sometimes, these complications can require emergency medical care and may be life threatening.  CAUSES Almost all URIs are caused by viruses. A virus is a type of germ and can spread from one person to another.  RISKS FACTORS You may be at risk for a URI if:   You smoke.   You have chronic heart or lung disease.  You have a weakened defense (immune) system.   You are very young or very old.   You have nasal allergies or asthma.  You work in crowded or poorly ventilated areas.  You work in health care facilities or schools. SIGNS AND SYMPTOMS  Symptoms typically develop 2-3 days after you come in contact with a cold virus. Most viral URIs last 7-10 days. However, viral URIs from the influenza virus (flu virus) can last 14-18 days and are typically more severe. Symptoms may include:   Runny or stuffy (congested) nose.   Sneezing.   Cough.   Sore throat.   Headache.   Fatigue.   Fever.   Loss of appetite.   Pain in your forehead, behind your eyes, and over your cheekbones (sinus pain).  Muscle aches.  DIAGNOSIS  Your health care provider may diagnose a URI by:  Physical exam.  Tests to check that your symptoms are not due to another condition such as:  Strep throat.  Sinusitis.  Pneumonia.  Asthma. TREATMENT  A URI goes away on its own with time. It cannot be cured with medicines, but medicines may be prescribed or recommended to relieve symptoms. Medicines may help:  Reduce your fever.  Reduce your cough.  Relieve nasal congestion. HOME CARE INSTRUCTIONS   Take medicines only as directed by your health care provider.   Gargle warm saltwater or take cough drops to comfort your throat as directed by your health care provider.  Use a warm mist humidifier or inhale steam from a shower to increase air moisture. This may make it easier to breathe.  Drink enough fluid to  keep your urine clear or pale yellow.   Eat soups and other clear broths and maintain good nutrition.   Rest as needed.   Return to work when your temperature has returned to normal or as your health care provider advises. You may need to stay home longer to avoid infecting others. You can also use a face mask and careful hand washing to prevent spread of the virus.  Increase the usage of your inhaler if you have asthma.   Do not use any tobacco products, including cigarettes, chewing tobacco, or electronic cigarettes. If you need help quitting, ask your health care provider. PREVENTION  The best way to protect yourself from getting a cold is to practice good hygiene.   Avoid oral or hand contact with people with cold symptoms.   Wash your hands often if contact occurs.  There is no clear evidence that vitamin C, vitamin E, echinacea, or exercise reduces the chance of developing a cold. However, it is always recommended to get plenty of rest,  exercise, and practice good nutrition.  SEEK MEDICAL CARE IF:   You are getting worse rather than better.   Your symptoms are not controlled by medicine.   You have chills.  You have worsening shortness of breath.  You have brown or red mucus.  You have yellow or brown nasal discharge.  You have pain in your face, especially when you bend forward.  You have a fever.  You have swollen neck glands.  You have pain while swallowing.  You have white areas in the back of your throat. SEEK IMMEDIATE MEDICAL CARE IF:   You have severe or persistent:  Headache.  Ear pain.  Sinus pain.  Chest pain.  You have chronic lung disease and any of the following:  Wheezing.  Prolonged cough.  Coughing up blood.  A change in your usual mucus.  You have a stiff neck.  You have changes in your:  Vision.  Hearing.  Thinking.  Mood. MAKE SURE YOU:   Understand these instructions.  Will watch your condition.  Will  get help right away if you are not doing well or get worse.   This information is not intended to replace advice given to you by your health care provider. Make sure you discuss any questions you have with your health care provider.   Document Released: 05/04/2001 Document Revised: 03/25/2015 Document Reviewed: 02/13/2014 Elsevier Interactive Patient Education [employer]

## 2016-02-14 NOTE — ED Notes (Signed)
Pt with cough and chills x 2 weeks.  Laryngitis.  Stuffy nose.  Sore throat.  Unknown for fever.  Pt states chest pain x 2 days.  Not related to cough.  Hx of pericarditis.

## 2016-02-14 NOTE — ED Provider Notes (Signed)
CSN: [id number]     Arrival date & time 02/14/16  0715 History   First MD Initiated Contact with Patient 02/14/16 [phone]     Chief Complaint  Patient presents with  . Chest Pain  . Cough     (Consider location/radiation/quality/duration/timing/severity/associated sxs/prior Treatment) Patient is a 45 y.o. female presenting with chest pain and cough. The history is provided by the patient.  Chest Pain Associated symptoms: cough   Associated symptoms: no abdominal pain, no back pain, no fever, no numbness, no shortness of breath, not vomiting and no weakness   Cough Associated symptoms: chest pain, myalgias and sore throat   Associated symptoms: no chills, no fever, no rash and no shortness of breath   Patient indicates in the past week has non productive cough, scratchy/sore throat, nasal congestion, sneezing, body aches, intermittent headaches. Cough episodic. w cough mid chest discomfort. Transient, brief. No exertional chest pain or discomfort. No change in chest pain whether supine or upright. No associated sob, nv or diaphoresis. No unusual fatigue or doe. Denies fam hx premature cad. Non smoker. Hx cocaine use. No fever or chills. No known ill contacts. Symptoms present x 1 week without acute change/new symptoms today.       Past Medical History  Diagnosis Date  . Vasculitis (HCC)   . Arthritis   . Pericarditis    Past Surgical History  Procedure Laterality Date  . Varicose vein surgery     Family History  Problem Relation Age of Onset  . Hypertension Mother   . Hyperlipidemia Mother   . Seizures Father    Social History  Substance Use Topics  . Smoking status: Never Smoker   . Smokeless tobacco: Never Used  . Alcohol Use: 0.0 oz/week    2-3 Cans of beer per week   OB History    No data available     Review of Systems  Constitutional: Negative for fever and chills.  HENT: Positive for congestion and sore throat.   Eyes: Negative for redness.  Respiratory:  Positive for cough. Negative for shortness of breath.   Cardiovascular: Positive for chest pain. Negative for leg swelling.  Gastrointestinal: Negative for vomiting and abdominal pain.  Genitourinary: Negative for dysuria and flank pain.  Musculoskeletal: Positive for myalgias. Negative for back pain and neck pain.  Skin: Negative for rash.  Neurological: Negative for weakness and numbness.  Hematological: Does not bruise/bleed easily.  Psychiatric/Behavioral: Negative for confusion.      Allergies  Review of patient's allergies indicates no known allergies.  Home Medications   Prior to Admission medications   Medication Sig Start Date End Date Taking? Authorizing Provider  aspirin EC 81 MG tablet Take 1 tablet (81 mg total) by mouth daily. 04/11/14   Zannie Cove, MD  cholecalciferol (VITAMIN D) 1000 UNITS tablet Take 1,000 Units by mouth daily.    Historical Provider, MD  Cinnamon 500 MG capsule Take 500 mg by mouth daily.    Historical Provider, MD  DiphenhydrAMINE HCl (BENADRYL PO) Take by mouth.    Historical Provider, MD  folic acid (FOLVITE) 1 MG tablet Take 1 mg by mouth daily.    Historical Provider, MD  Garlic 100 MG TABS Take by mouth.    Historical Provider, MD  ibuprofen (ADVIL,MOTRIN) 800 MG tablet Take 1 tablet (800 mg total) by mouth 3 (three) times daily.  TID for 1 week then  TID for 2 weeks then  TID for 1 week 04/11/14   Zannie Cove, MD  methotrexate (RHEUMATREX) 2.5 MG tablet Take 1.6 mg by mouth once a week.    Historical Provider, MD  Multiple Vitamin (MULTIVITAMIN WITH MINERALS) TABS Take 1 tablet by mouth daily.    Historical Provider, MD  Multiple Vitamins-Minerals (ZINC PO) Take by mouth daily.    Historical Provider, MD  predniSONE (DELTASONE) 10 MG tablet Take 10 mg by mouth daily with breakfast.    Historical Provider, MD   BP 126/92 mmHg  Pulse 91  Temp(Src) 97.8 F (36.6 C) (Oral)  Resp 18  SpO2 100%  LMP 02/02/2016 Physical  Exam  Constitutional: She appears well-developed and well-nourished. No distress.  HENT:  Mouth/Throat: Oropharynx is clear and moist.  Mild nasal congestion. Pharynx normal.   Eyes: Conjunctivae are normal. No scleral icterus.  Neck: Neck supple. No tracheal deviation present.  No stiffness or rigidity.   Cardiovascular: Normal rate, regular rhythm, normal heart sounds and intact distal pulses.  Exam reveals no gallop and no friction rub.   No murmur heard. Pulmonary/Chest: Effort normal. No respiratory distress. She exhibits tenderness.  occ non prod cough.   Abdominal: Soft. Normal appearance and bowel sounds are normal. She exhibits no distension. There is no tenderness.  Musculoskeletal: She exhibits no edema or tenderness.  No leg pain, swelling, edema, or tenderness.   Lymphadenopathy:    She has no cervical adenopathy.  Neurological: She is alert.  Skin: Skin is warm and dry. No rash noted. She is not diaphoretic.  Psychiatric: She has a normal mood and affect.  Nursing note and vitals reviewed.   ED Course  Procedures (including critical care time)  Results for orders placed or performed during the hospital encounter of 02/14/16  I-stat troponin, ED  Result Value Ref Range   Troponin i, poc 0.03 0.00 - 0.08 ng/mL   Comment 3           Dg Chest 2 View  02/14/2016  CLINICAL DATA:  Intermittent mid chest pain. Cough. History of pericarditis. EXAM: CHEST  2 VIEW COMPARISON:  04/08/2014 FINDINGS: Normal cardiac silhouette and mediastinal contours. No focal airspace opacities. No pleural effusion or pneumothorax. No evidence of edema. No acute osseus abnormalities. IMPRESSION: No acute cardiopulmonary disease. Specifically, no evidence of pneumonia. Electronically Signed   By: Simonne ComeJohn  Watts M.D.   On: 02/14/2016 08:25      I have personally reviewed and evaluated these lab results as part of my medical decision-making.   EKG Interpretation   Date/Time:  Saturday February 14 2016 14:78:[id number]:26:28 EDT Ventricular Rate:  87 PR Interval:  150 QRS Duration: 90 QT Interval:  348 QTC Calculation: 419 R Axis:   77 Text Interpretation:  Sinus rhythm Nonspecific ST abnormality Confirmed by  Denton LankSTEINL  MD, Caryn BeeKEVIN ([id number]) on 02/14/2016 7:35:13 AM      MDM   Patients symptoms/exam felt most c/w viral uri.  Motrin po.  Rec OTC meds re symptom relief.  Reviewed nursing notes and prior charts for additional history.   After symptoms x 1 week, trop neg. cxr neg for pna.  Pt currently appears stable for d/c.      Cathren LaineKevin Ileene Allie, MD 02/14/16 [phone]

## 2016-02-26 ENCOUNTER — Ambulatory Visit: Payer: Self-pay

## 2016-03-15 ENCOUNTER — Ambulatory Visit: Payer: Self-pay

## 2016-04-03 IMAGING — CR DG CHEST 2V
2 series · 2 of 2 positions shown · non-contrast
Comparison: 04/08/2014

CLINICAL DATA: Intermittent mid chest pain. Cough. History of
pericarditis.

EXAM:
CHEST  2 VIEW

[w chest pa]
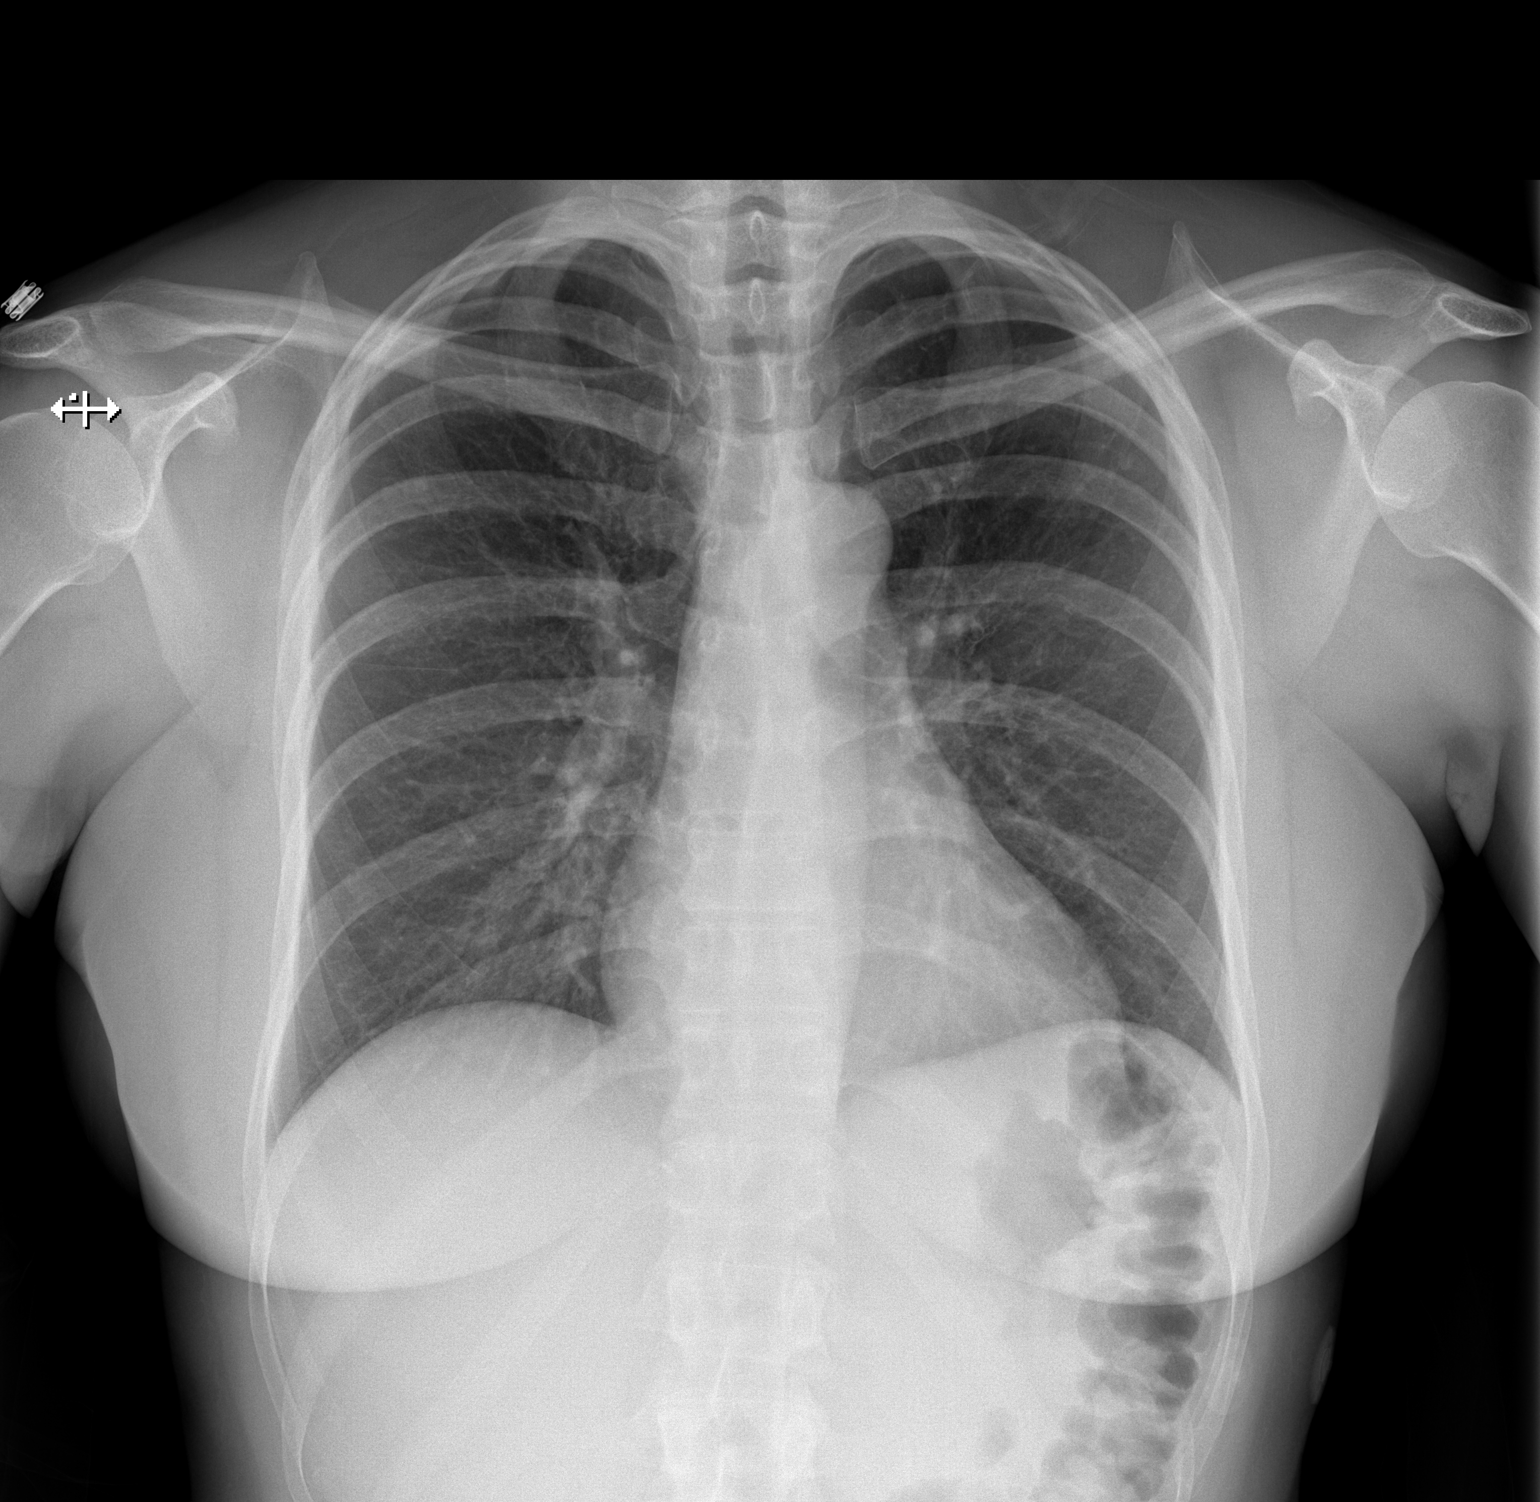

[w chest lat]
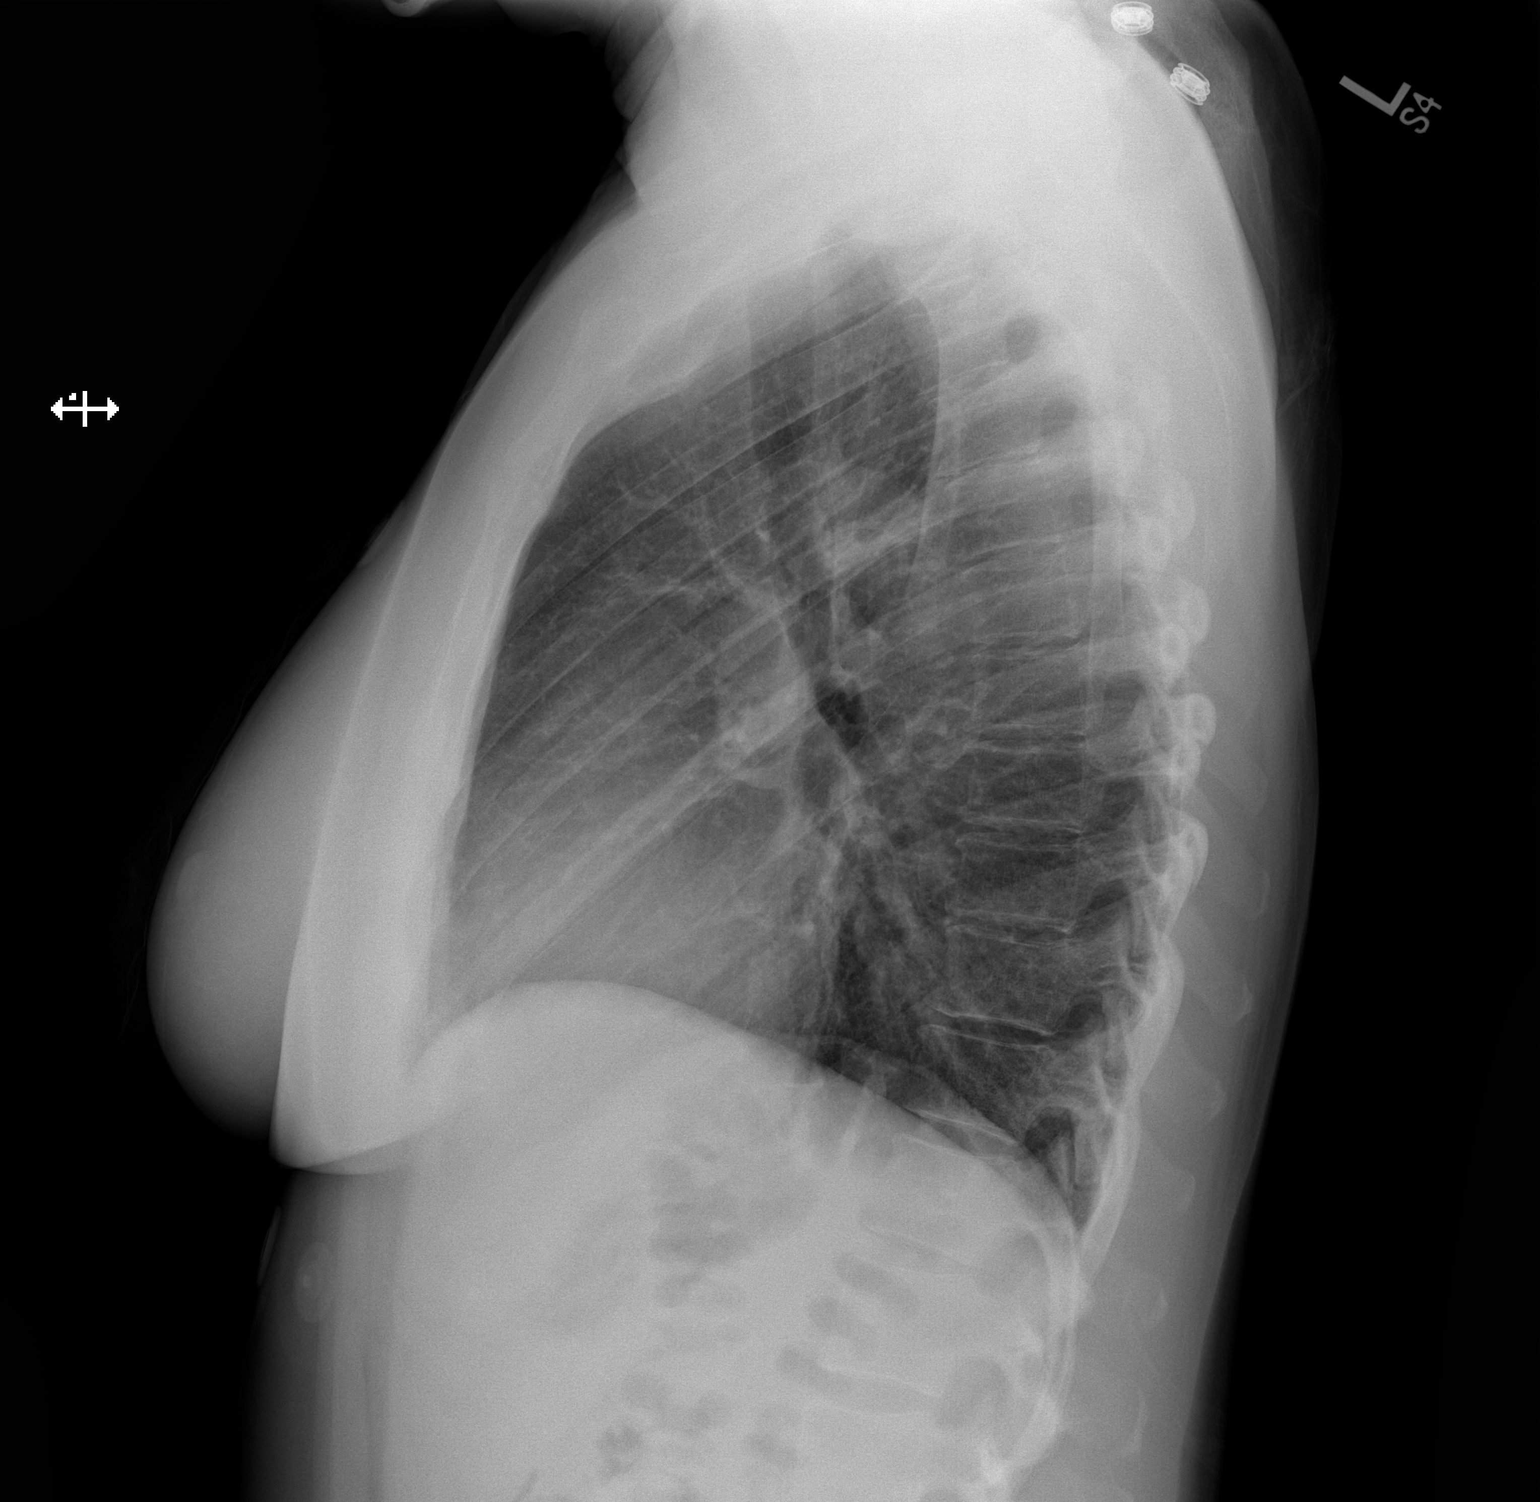

[2 of 2 positions shown; findings below may reference images not displayed]

FINDINGS: Normal cardiac silhouette and mediastinal contours. No focal
airspace opacities. No pleural effusion or pneumothorax. No evidence
of edema. No acute osseus abnormalities.
IMPRESSION: No acute cardiopulmonary disease. Specifically, no evidence of
pneumonia.

## 2016-05-28 ENCOUNTER — Encounter (HOSPITAL_COMMUNITY): Payer: Self-pay | Admitting: Emergency Medicine

## 2016-05-28 ENCOUNTER — Emergency Department (HOSPITAL_COMMUNITY)
Admission: EM | Admit: 2016-05-28 | Discharge: 2016-05-28 | Disposition: A | Discharge status: Home / Self Care | Payer: Self-pay | Attending: Emergency Medicine | Admitting: Emergency Medicine

## 2016-05-28 ENCOUNTER — Emergency Department (HOSPITAL_COMMUNITY): Payer: Self-pay

## 2016-05-28 DIAGNOSIS — Z7982 Long term (current) use of aspirin: Secondary | ICD-10-CM | POA: Insufficient documentation

## 2016-05-28 DIAGNOSIS — Z791 Long term (current) use of non-steroidal anti-inflammatories (NSAID): Secondary | ICD-10-CM | POA: Insufficient documentation

## 2016-05-28 DIAGNOSIS — Z79899 Other long term (current) drug therapy: Secondary | ICD-10-CM | POA: Insufficient documentation

## 2016-05-28 DIAGNOSIS — M1990 Unspecified osteoarthritis, unspecified site: Secondary | ICD-10-CM | POA: Insufficient documentation

## 2016-05-28 DIAGNOSIS — J189 Pneumonia, unspecified organism: Secondary | ICD-10-CM | POA: Insufficient documentation

## 2016-05-28 LAB — BASIC METABOLIC PANEL
ANION GAP: 7 (ref 5–15)
BUN: 7 mg/dL (ref 6–20)
CALCIUM: 9.2 mg/dL (ref 8.9–10.3)
CO2: 23 mmol/L (ref 22–32)
Chloride: 103 mmol/L (ref 101–111)
Creatinine, Ser: 0.82 mg/dL (ref 0.44–1.00)
GFR calc Af Amer: >60 mL/min (ref >60)
GFR calc non Af Amer: >60 mL/min (ref >60)
GLUCOSE: 112 mg/dL — AB (ref 65–99)
POTASSIUM: 3.9 mmol/L (ref 3.5–5.1)
Sodium: 133 mmol/L — ABNORMAL LOW (ref 135–145)

## 2016-05-28 LAB — CBC
HEMATOCRIT: 38.4 % (ref 36.0–46.0)
HEMOGLOBIN: 13.5 g/dL (ref 12.0–15.0)
MCH: 31.9 pg (ref 26.0–34.0)
MCHC: 35.2 g/dL (ref 30.0–36.0)
MCV: 90.8 fL (ref 78.0–100.0)
Platelets: 358 10*3/uL (ref 150–400)
RBC: 4.23 MIL/uL (ref 3.87–5.11)
RDW: 14.1 % (ref 11.5–15.5)
WBC: 12 10*3/uL — ABNORMAL HIGH (ref 4.0–10.5)

## 2016-05-28 LAB — I-STAT TROPONIN, ED: Troponin i, poc: 0 ng/mL (ref 0.00–0.08)

## 2016-05-28 MED ORDER — ACETAMINOPHEN-CODEINE 120-12 MG/5ML PO SOLN: 10.0000 mL | ORAL | Status: DC | PRN

## 2016-05-28 MED ORDER — GUAIFENESIN ER 1200 MG PO TB12: 1.0000 | ORAL_TABLET | Freq: Two times a day (BID) | ORAL | Status: DC

## 2016-05-28 MED ORDER — DOXYCYCLINE HYCLATE 100 MG PO CAPS: 100.0000 mg | ORAL_CAPSULE | Freq: Two times a day (BID) | ORAL | Status: DC

## 2016-05-28 MED ORDER — CEFTRIAXONE SODIUM 1 G IJ SOLR
1.0000 g | Freq: Once | INTRAMUSCULAR | Status: AC
  Administered 2016-05-28: 1 g via INTRAVENOUS
  Filled 2016-05-28: qty 10

## 2016-05-28 MED ORDER — KETOROLAC TROMETHAMINE 30 MG/ML IJ SOLN
30.0000 mg | Freq: Once | INTRAMUSCULAR | Status: AC
  Administered 2016-05-28: 30 mg via INTRAVENOUS
  Filled 2016-05-28: qty 1

## 2016-05-28 MED ORDER — IOPAMIDOL (ISOVUE-370) INJECTION 76%
100.0000 mL | Freq: Once | INTRAVENOUS | Status: AC | PRN
  Administered 2016-05-28: 73 mL via INTRAVENOUS

## 2016-05-28 NOTE — Discharge Instructions (Signed)
Return here as needed.  Follow-up with a primary care doctor, increase your fluid intake °

## 2016-05-28 NOTE — ED Notes (Signed)
Pt c/o central chest pain since Tuesday. Pt states it feels like her chest is being hit by a sledge hammer. Pt has a hx of pericarditis and vasculitis. Pt states she had 2 episodes of emesis.

## 2016-05-28 NOTE — ED Provider Notes (Signed)
CSN: [id number]     Arrival date & time 05/28/16  1339 History   First MD Initiated Contact with Patient 05/28/16 1547     Chief Complaint  Patient presents with  . Chest Pain     (Consider location/radiation/quality/duration/timing/severity/associated sxs/prior Treatment) HPI Patient presents to the emergency department with chest discomfort that started 3 days ago.  She states she has also had cough and the pain increases with coughing and deep breathing.  The patient states that she did not take any medications prior to arrival.  She states she has used cocaine over the last 3 days to try to help with her symptoms.  Patient states that nothing seems to make the condition better.  Patient states that she is concerned that it could be pericarditis she has had this in the past.The patient denies shortness of breath, headache,blurred vision, neck pain, fever,weakness, numbness, dizziness, anorexia, edema, abdominal pain, nausea, vomiting, diarrhea, rash, back pain, dysuria, hematemesis, bloody stool, near syncope, or syncope. Past Medical History  Diagnosis Date  . Vasculitis (HCC)   . Arthritis   . Pericarditis    Past Surgical History  Procedure Laterality Date  . Varicose vein surgery     Family History  Problem Relation Age of Onset  . Hypertension Mother   . Hyperlipidemia Mother   . Seizures Father    Social History  Substance Use Topics  . Smoking status: Never Smoker   . Smokeless tobacco: Never Used  . Alcohol Use: 0.0 oz/week    2-3 Cans of beer per week   OB History    No data available     Review of Systems All other systems negative except as documented in the HPI. All pertinent positives and negatives as reviewed in the HPI.   Allergies  Review of patient's allergies indicates no known allergies.  Home Medications   Prior to Admission medications   Medication Sig Start Date End Date Taking? Authorizing Provider  aspirin EC 81 MG tablet Take 1 tablet (81  mg total) by mouth daily. 04/11/14  Yes Zannie Cove, MD  Aspirin-Salicylamide-Caffeine [facility] HEADACHE POWDER PO) Take 1 Dose by mouth daily as needed (headache/pain).   Yes Historical Provider, MD  cholecalciferol (VITAMIN D) 1000 UNITS tablet Take 1,000 Units by mouth daily.   Yes Historical Provider, MD  Cinnamon 500 MG capsule Take 500 mg by mouth daily.   Yes Historical Provider, MD  ECHINACEA PO Take 1 Dose by mouth daily.   Yes Historical Provider, MD  folic acid (FOLVITE) 1 MG tablet Take 1 mg by mouth daily.   Yes Historical Provider, MD  Garlic 100 MG TABS Take 100 mg by mouth daily.    Yes Historical Provider, MD  Ginger, Zingiber officinalis, (GINGER PO) Take 1 capsule by mouth daily.   Yes Historical Provider, MD  ibuprofen (ADVIL,MOTRIN) 200 MG tablet Take 200 mg by mouth every 6 (six) hours as needed for headache, mild pain or moderate pain.   Yes Historical Provider, MD  methotrexate 50 MG/2ML injection Inject 20 mg into the vein once a week.   Yes Historical Provider, MD  Multiple Vitamin (MULTIVITAMIN WITH MINERALS) TABS Take 1 tablet by mouth daily.   Yes Historical Provider, MD   BP 122/83 mmHg  Pulse 84  Temp(Src) 98.6 F (37 C) (Oral)  Resp 24  SpO2 98% Physical Exam  Constitutional: She is oriented to person, place, and time. She appears well-developed and well-nourished. No distress.  HENT:  Head: Normocephalic and  atraumatic.  Mouth/Throat: Oropharynx is clear and moist.  Eyes: Pupils are equal, round, and reactive to light.  Neck: Normal range of motion. Neck supple.  Cardiovascular: Normal rate, regular rhythm and normal heart sounds.  Exam reveals no gallop and no friction rub.   No murmur heard. Pulmonary/Chest: Effort normal and breath sounds normal. No respiratory distress. She has no wheezes. She exhibits tenderness.  Neurological: She is alert and oriented to person, place, and time. She exhibits normal muscle tone. Coordination normal.  Skin: Skin is warm  and dry. No rash noted. No erythema.  Psychiatric: She has a normal mood and affect. Her behavior is normal.  Nursing note and vitals reviewed.   ED Course  Procedures (including critical care time) Labs Review Labs Reviewed  BASIC METABOLIC PANEL - Abnormal; Notable for the following:    Sodium 133 (*)    Glucose, Bld 112 (*)    All other components within normal limits  CBC - Abnormal; Notable for the following:    WBC 12.0 (*)    All other components within normal limits  I-STAT TROPOININ, ED    Imaging Review Dg Chest 2 View  05/28/2016  CLINICAL DATA:  Chest pain EXAM: CHEST  2 VIEW COMPARISON:  02/14/2016 chest radiograph. FINDINGS: Stable cardiomediastinal silhouette with top-normal heart size. No pneumothorax. No pleural effusion. No pulmonary edema. Curvilinear bibasilar lung opacities, favor atelectasis. IMPRESSION: Curvilinear bibasilar lung opacities, favor atelectasis. Electronically Signed   By: Delbert PhenixJason A Poff M.D.   On: 05/28/2016 14:40   Ct Angio Chest Pe W/cm &/or Wo Cm  05/28/2016  CLINICAL DATA:  Chest pain for 3 days EXAM: CT ANGIOGRAPHY CHEST WITH CONTRAST TECHNIQUE: Multidetector CT imaging of the chest was performed using the standard protocol during bolus administration of intravenous contrast. Multiplanar CT image reconstructions and MIPs were obtained to evaluate the vascular anatomy. CONTRAST:  73 mL Isovue 370 nonionic COMPARISON:  Chest radiograph May 28, 2016 FINDINGS: Cardiovascular: There is no demonstrable pulmonary embolus. There is no thoracic aortic aneurysm or dissection. The visualized great vessels appear normal. There is a fairly small but present pericardial effusion. Mediastinum/Nodes: Thyroid appears normal. There is no appreciable adenopathy. Lungs/Pleura: There is airspace consolidation in both lower lobes. There is a small partially loculated pleural effusion on the right in the posterior basilar region. Lungs elsewhere are clear. Upper Abdomen:  Visualized upper abdominal structures appear unremarkable. Musculoskeletal: There are no blastic or lytic bone lesions. Review of the MIP images confirms the above findings. IMPRESSION: No demonstrable pulmonary embolus. Pericardial effusion present.  Etiology uncertain. Airspace consolidation most likely due to pneumonia in both lower lobes. Small loculated pleural effusion posterior right base. No demonstrable adenopathy. Electronically Signed   By: Bretta BangWilliam  Woodruff III M.D.   On: 05/28/2016 17:16   I have personally reviewed and evaluated these images and lab results as part of my medical decision-making.   EKG Interpretation   Date/Time:  Friday May 28 2016 13:52:18 EDT Ventricular Rate:  97 PR Interval:    QRS Duration: 90 QT Interval:  333 QTC Calculation: 423 R Axis:   68 Text Interpretation:  Sinus rhythm Left atrial enlargement Low voltage,  precordial leads ARTIFACT LIMITS EVALUATION Confirmed by Bebe ShaggyWICKLINE  MD,  Dorinda HillNALD ([id number]) on 05/28/2016 3:46:27 PM      The patient will be treated for pneumonia.  She is advised to return here as needed.  The patient has had normal vital signs she is advised to increase her fluid intake and  rest as much as possible.  It seems unlikely to be pericarditis at this point    Charlestine Nighthristopher Zayyan Mullen, PA-C 05/28/16 1857  Melene Planan Floyd, DO 05/31/16 [phone]

## 2016-06-20 ENCOUNTER — Emergency Department (HOSPITAL_BASED_OUTPATIENT_CLINIC_OR_DEPARTMENT_OTHER): Payer: Self-pay

## 2016-06-20 ENCOUNTER — Emergency Department (HOSPITAL_COMMUNITY): Payer: Self-pay

## 2016-06-20 ENCOUNTER — Encounter (HOSPITAL_COMMUNITY): Payer: Self-pay | Admitting: Emergency Medicine

## 2016-06-20 ENCOUNTER — Emergency Department (HOSPITAL_COMMUNITY)
Admission: EM | Admit: 2016-06-20 | Discharge: 2016-06-20 | Disposition: A | Discharge status: Home / Self Care | Payer: Self-pay | Attending: Emergency Medicine | Admitting: Emergency Medicine

## 2016-06-20 DIAGNOSIS — I309 Acute pericarditis, unspecified: Secondary | ICD-10-CM | POA: Insufficient documentation

## 2016-06-20 DIAGNOSIS — Z79899 Other long term (current) drug therapy: Secondary | ICD-10-CM | POA: Insufficient documentation

## 2016-06-20 DIAGNOSIS — Z791 Long term (current) use of non-steroidal anti-inflammatories (NSAID): Secondary | ICD-10-CM | POA: Insufficient documentation

## 2016-06-20 DIAGNOSIS — F1490 Cocaine use, unspecified, uncomplicated: Secondary | ICD-10-CM | POA: Insufficient documentation

## 2016-06-20 DIAGNOSIS — I319 Disease of pericardium, unspecified: Secondary | ICD-10-CM

## 2016-06-20 DIAGNOSIS — I300 Acute nonspecific idiopathic pericarditis: Secondary | ICD-10-CM

## 2016-06-20 DIAGNOSIS — Z7982 Long term (current) use of aspirin: Secondary | ICD-10-CM | POA: Insufficient documentation

## 2016-06-20 DIAGNOSIS — R0689 Other abnormalities of breathing: Secondary | ICD-10-CM | POA: Insufficient documentation

## 2016-06-20 LAB — BASIC METABOLIC PANEL
ANION GAP: 4 — AB (ref 5–15)
BUN: 10 mg/dL (ref 6–20)
CALCIUM: 9.5 mg/dL (ref 8.9–10.3)
CHLORIDE: 107 mmol/L (ref 101–111)
CO2: 27 mmol/L (ref 22–32)
Creatinine, Ser: 0.87 mg/dL (ref 0.44–1.00)
GFR calc Af Amer: >60 mL/min (ref >60)
GFR calc non Af Amer: >60 mL/min (ref >60)
GLUCOSE: 105 mg/dL — AB (ref 65–99)
Potassium: 3.9 mmol/L (ref 3.5–5.1)
Sodium: 138 mmol/L (ref 135–145)

## 2016-06-20 LAB — CBC
HCT: 38.9 % (ref 36.0–46.0)
HEMOGLOBIN: 12.8 g/dL (ref 12.0–15.0)
MCH: 30.2 pg (ref 26.0–34.0)
MCHC: 32.9 g/dL (ref 30.0–36.0)
MCV: 91.7 fL (ref 78.0–100.0)
Platelets: 402 10*3/uL — ABNORMAL HIGH (ref 150–400)
RBC: 4.24 MIL/uL (ref 3.87–5.11)
RDW: 13.3 % (ref 11.5–15.5)
WBC: 10.3 10*3/uL (ref 4.0–10.5)

## 2016-06-20 LAB — I-STAT TROPONIN, ED
TROPONIN I, POC: 0 ng/mL (ref 0.00–0.08)
Troponin i, poc: 0 ng/mL (ref 0.00–0.08)

## 2016-06-20 LAB — D-DIMER, QUANTITATIVE (NOT AT ARMC): D DIMER QUANT: 1.25 ug{FEU}/mL — AB (ref 0.00–0.50)

## 2016-06-20 LAB — C-REACTIVE PROTEIN: CRP: 3.2 mg/dL — ABNORMAL HIGH (ref <1.0)

## 2016-06-20 LAB — ECHOCARDIOGRAM COMPLETE
HEIGHTINCHES: 68 in
WEIGHTICAEL: 2960 [oz_av]

## 2016-06-20 LAB — SEDIMENTATION RATE: Sed Rate: 5 mm/hr (ref 0–22)

## 2016-06-20 LAB — BRAIN NATRIURETIC PEPTIDE: B NATRIURETIC PEPTIDE 5: 41.8 pg/mL (ref 0.0–100.0)

## 2016-06-20 MED ORDER — COLCHICINE 0.6 MG PO TABS
0.6000 mg | ORAL_TABLET | Freq: Once | ORAL | Status: AC
  Administered 2016-06-20: 0.6 mg via ORAL
  Filled 2016-06-20: qty 1

## 2016-06-20 MED ORDER — KETOROLAC TROMETHAMINE 15 MG/ML IJ SOLN
15.0000 mg | Freq: Once | INTRAMUSCULAR | Status: AC
  Administered 2016-06-20: 15 mg via INTRAVENOUS
  Filled 2016-06-20: qty 1

## 2016-06-20 MED ORDER — INDOMETHACIN 25 MG PO CAPS: 25.0000 mg | ORAL_CAPSULE | Freq: Three times a day (TID) | ORAL | 0 refills | Status: DC

## 2016-06-20 MED ORDER — HYDROCODONE-ACETAMINOPHEN 5-325 MG PO TABS: 1.0000 | ORAL_TABLET | ORAL | 0 refills | Status: DC | PRN

## 2016-06-20 MED ORDER — COLCHICINE 0.6 MG PO TABS: 0.6000 mg | ORAL_TABLET | Freq: Two times a day (BID) | ORAL | 0 refills | Status: DC

## 2016-06-20 MED ORDER — RANITIDINE HCL 150 MG PO TABS: 150.0000 mg | ORAL_TABLET | Freq: Every day | ORAL | 0 refills | Status: DC

## 2016-06-20 MED ORDER — MORPHINE SULFATE (PF) 4 MG/ML IV SOLN: 4.0000 mg | Freq: Once | INTRAVENOUS | Status: DC

## 2016-06-20 NOTE — Progress Notes (Signed)
Blood drawn and sent . 5:25pm

## 2016-06-20 NOTE — Progress Notes (Signed)
  Echocardiogram 2D Echocardiogram has been performed.  Nolon Rod 06/20/2016, 3:33 PM

## 2016-06-20 NOTE — ED Provider Notes (Addendum)
[facility]-EMERGENCY DEPT Provider Note   CSN: [id number] Arrival date & time: 06/20/16  4098  First Provider Contact:  First MD Initiated Contact with Patient 06/20/16 1104        History   Chief Complaint Chief Complaint  Patient presents with  . Chest Pain  . Cough    HPI [name] Santana is a 45 y.o. female.  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 Pt reports a 1 month history of constant substernal chest pain. She describes the pain as a pressure-like substernal and left-sided CP that is positional, made worse with lying flat or on her side. Pain also intermittently made worse with breathing but denies any SOB. NO cough, hemoptysis. She has h/o pericarditis that felt "just like this." SHe was seen on 7/7 for these sx at which time CTA PE was negative and she was sent home with possible MSK chest wall pain. She has taken some ibuprofen with no success. She has not seen her PCP. She subsequently presents for evaluation. No LE swelling.  Past Medical History:  Diagnosis Date  . Arthritis   . Pericarditis   . Vasculitis [facility])     Patient Active Problem List   Diagnosis Date Noted  . Pericarditis 04/08/2014  . Chest pain of pericarditis 04/08/2014  . ST segment elevation - pericarditis 04/08/2014  . Acute pericarditis 04/08/2014  . Vasculitis [facility])     Past Surgical History:  Procedure Laterality Date  . VARICOSE VEIN SURGERY      OB History    No data available       Home Medications    Prior to Admission medications   Medication Sig Start Date End Date Taking? Authorizing Provider  aspirin EC 81 MG tablet Take 1 tablet (81 mg total) by mouth daily. 04/11/14  Yes Zannie Cove, MD  Aspirin-Salicylamide-Caffeine (BC HEADACHE POWDER PO) Take 1 packet by mouth every 8 (eight) hours as needed (headache/pain).    Yes Historical Provider, MD    cholecalciferol (VITAMIN D) 1000 UNITS tablet Take 1,000 Units by mouth daily.   Yes Historical Provider, MD  doxycycline (VIBRAMYCIN) 100 MG capsule Take 1 capsule (100 mg total) by mouth 2 (two) times daily. 05/28/16  Yes Christopher Lawyer, PA-C  ECHINACEA PO Take 1 Dose by mouth daily.   Yes Historical Provider, MD  folic acid (FOLVITE) 1 MG tablet Take 1 mg by mouth daily.   Yes Historical Provider, MD  Garlic 100 MG TABS Take 100 mg by mouth daily.    Yes Historical Provider, MD  Ginger, Zingiber officinalis, (GINGER PO) Take 1 capsule by mouth daily.   Yes Historical Provider, MD  ibuprofen (ADVIL,MOTRIN) 200 MG tablet Take 200 mg by mouth every 6 (six) hours as needed for headache, mild pain or moderate pain.   Yes Historical Provider, MD  Multiple Vitamin (MULTIVITAMIN WITH MINERALS) TABS Take 1 tablet by mouth daily.   Yes Historical Provider, MD  acetaminophen-codeine 120-12 MG/5ML solution Take 10 mLs by mouth every 4 (four) hours as needed for moderate pain. Patient not taking: Reported on 06/20/2016 05/28/16   Charlestine Night, PA-C  colchicine 0.6 MG tablet Take 1 tablet (0.6 mg total) by mouth 2 (two) times daily. 06/20/16   Shaune Pollack, MD  Guaifenesin 1200 MG TB12 Take 1 tablet (1,200 mg total) by mouth 2 (two) times daily. Patient not taking: Reported on 06/20/2016 05/28/16  Charlestine Night, PA-C  HYDROcodone-acetaminophen (NORCO/VICODIN) 5-325 MG tablet Take 1 tablet by mouth every 4 (four) hours as needed for severe pain. 06/20/16   Shaune Pollack, MD  indomethacin (INDOCIN) 25 MG capsule Take 1 capsule (25 mg total) by mouth 3 (three) times daily with meals. 06/20/16   Shaune Pollack, MD  methotrexate 50 MG/2ML injection Inject 20 mg into the vein once a week.    Historical Provider, MD  ranitidine (ZANTAC) 150 MG tablet Take 1 tablet (150 mg total) by mouth daily. 06/20/16   Shaune Pollack, MD    Family History Family History  Problem Relation Age of Onset  . Hypertension  Mother   . Hyperlipidemia Mother   . Seizures Father     Social History Social History  Substance Use Topics  . Smoking status: Never Smoker  . Smokeless tobacco: Never Used  . Alcohol use 0.0 oz/week    2 - 3 Cans of beer per week     Allergies   Review of patient's allergies indicates no known allergies.   Review of Systems Review of Systems  Constitutional: Positive for fatigue. Negative for chills and fever.  HENT: Negative for congestion and rhinorrhea.   Eyes: Negative for visual disturbance.  Respiratory: Positive for chest tightness. Negative for cough, shortness of breath and wheezing.   Cardiovascular: Positive for chest pain. Negative for palpitations and leg swelling.  Gastrointestinal: Negative for abdominal pain, diarrhea, nausea and vomiting.  Genitourinary: Negative for flank pain.  Musculoskeletal: Negative for neck pain.  Skin: Negative for rash.  Allergic/Immunologic: Positive for immunocompromised state.  Neurological: Negative for syncope, weakness and headaches.     Physical Exam Updated Vital Signs BP 114/68 (BP Location: Right Arm)   Pulse 95   Temp 99 F (37.2 C) (Oral)   Resp 18   Ht 5\' 8"  (1.727 m)   Wt 185 lb (83.9 kg)   SpO2 98%   BMI 28.13 kg/m   Physical Exam  Constitutional: She appears well-developed and well-nourished. No distress.  HENT:  Head: Normocephalic and atraumatic.  Mouth/Throat: Oropharynx is clear and moist. No oropharyngeal exudate.  Eyes: Conjunctivae are normal. Pupils are equal, round, and reactive to light.  Neck: Normal range of motion. Neck supple.  Cardiovascular: Normal rate, regular rhythm, normal heart sounds and intact distal pulses.  Exam reveals no friction rub.   No murmur heard. Pulmonary/Chest: Effort normal and breath sounds normal. No respiratory distress. She has no wheezes. She has no rales.  Abdominal: Soft. Bowel sounds are normal. She exhibits no distension. There is no tenderness.    Musculoskeletal: She exhibits no edema.  Neurological: She is alert. She exhibits normal muscle tone.  Skin: Skin is warm. Capillary refill takes less than 2 seconds.  Nursing note and vitals reviewed.    ED Treatments / Results  Labs (all labs ordered are listed, but only abnormal results are displayed) Labs Reviewed  BASIC METABOLIC PANEL - Abnormal; Notable for the following:       Result Value   Glucose, Bld 105 (*)    Anion gap 4 (*)    All other components within normal limits  CBC - Abnormal; Notable for the following:    Platelets 402 (*)    All other components within normal limits  C-REACTIVE PROTEIN - Abnormal; Notable for the following:    CRP 3.2 (*)    All other components within normal limits  D-DIMER, QUANTITATIVE (NOT AT [facility]) - Abnormal; Notable for the following:  D-Dimer, Quant 1.25 (*)    All other components within normal limits  SEDIMENTATION RATE  BRAIN NATRIURETIC PEPTIDE  I-STAT TROPOININ, ED  I-STAT TROPOININ, ED    EKG  EKG Interpretation  Date/Time:  Sunday June 20 2016 10:13:28 EDT Ventricular Rate:  92 PR Interval:    QRS Duration: 75 QT Interval:  341 QTC Calculation: 422 R Axis:   67 Text Interpretation:  Sinus rhythm Low voltage, precordial leads No significant change since last tracing Confirmed by Dannetta Lekas MD, Sheria Lang [phone]) on 06/20/2016 10:16:36 AM       Radiology Dg Chest 2 View  Result Date: 06/20/2016 CLINICAL DATA:  Nonproductive cough, left upper chest pain for 1 week. EXAM: CHEST  2 VIEW COMPARISON:  05/28/2016 FINDINGS: Linear densities in the lung bases compatible with atelectasis. Heart is normal size. No effusions or acute bony abnormality. IMPRESSION: Bibasilar atelectasis. Electronically Signed   By: Charlett Nose M.D.   On: 06/20/2016 11:07   Procedures Procedures (including critical care time)  Medications Ordered in ED Medications  ketorolac (TORADOL) 15 MG/ML injection 15 mg (15 mg Intravenous Given  06/20/16 1252)  colchicine tablet 0.6 mg (0.6 mg Oral Given 06/20/16 1741)     Initial Impression / Assessment and Plan / ED Course  I have reviewed the triage vital signs and the nursing notes.  Pertinent labs & imaging results that were available during my care of the patient were reviewed by me and considered in my medical decision making (see chart for details).  Clinical Course   45 yo F with PMHx of unknown vasculitis, h/o pericarditis, who presents with a one month h/o sharp, positional, but also pressure-like chest pain. Feels similar to her prior pericarditis episode. No fever or chills. VSS and WNL on arrival and pt is overall very well-appearing and in NAD. 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 As mentioned in HPI, pt as just seen on 7/7 for this with negative troponins and CTA PE. Pt has no hypoxia, cough, hemoptysis, sputum production, h/o PE, and given negative CTA PE, I do not suspect pulmonary embolism. Pain is not tearing, does not radiate to back, pt has no hTN - do not suspect dissection. Of note, on review of recent CTA PE pt does have pericardial effusion, which would fit with pericarditis. Will check screening labs, also obtain TTE given possible pericarditis x 1 month with effusion on recent CT.  Labs and imaging reviewed as above. EKG shows likely normalization of prior mild diffuse ST elevations, c/w ongoing pericarditis. No STEMI. CBC shows no leukocytosis or anemia. Both CRP and D-Dimer elevated, c/w pericarditis 2/2 underlying autoimmune condition. As mentioned. Recent CTA PE, normal O2, normal WOB make PE less likely. Trop negative x 2, reassuring with no signs of myocarditis. Furthermore, TTE shows only trace effusion, normal EF and no WMA - suspect mild pericarditis, no indication for admission at this time. Labs o/w reassuring. Discussed case with Cardiology, who reviewed echo and agree, recommend outpt Rheum follow-up. I contacted pt's PCP and primary  Rheumatologist at [facility] - will f/u this week with her specialist. Records reviewed from last episode of preicarditis - she had good response to colchicine/NSAIDs. Will treat with colchicine, indomethacin and outpt f/u. [facility] with colchicine. First dose given here. Return precautions given.   Final Clinical Impressions(s) / ED Diagnoses   Final diagnoses:  Pericarditis    New Prescriptions Discharge Medication List as of 06/20/2016  5:11  PM    START taking these medications   Details  colchicine 0.6 MG tablet Take 1 tablet (0.6 mg total) by mouth 2 (two) times daily., Starting Sun 06/20/2016, Print    HYDROcodone-acetaminophen (NORCO/VICODIN) 5-325 MG tablet Take 1 tablet by mouth every 4 (four) hours as needed for severe pain., Starting Sun 06/20/2016, Print    indomethacin (INDOCIN) 25 MG capsule Take 1 capsule (25 mg total) by mouth 3 (three) times daily with meals., Starting Sun 06/20/2016, Print    ranitidine (ZANTAC) 150 MG tablet Take 1 tablet (150 mg total) by mouth daily., Starting Sun 06/20/2016, Print         Shaune Pollack, MD 06/21/16 2956    Shaune Pollack, MD 06/21/16 [phone]

## 2016-06-20 NOTE — ED Triage Notes (Signed)
Patient states that she was seen here earlier this month for cough. Patient states that she still having left sided chest pain that radiates to left arm with SOB, weakness.  Patient has cough with little clear phlegm.  Patient denies fevers but states that she has hot flashes.

## 2016-06-21 MED FILL — COLCHICINE 0.6 MG TABLET: 0.6 | 14 days supply | Qty: 28 | Fill #0

## 2016-06-21 MED FILL — INDOMETHACIN 25 MG CAPSULE: 25 | 10 days supply | Qty: 30 | Fill #0

## 2016-06-21 MED FILL — raNITIdine HCL 150 MG TABS: 150 | 30 days supply | Qty: 30 | Fill #0

## 2017-06-02 ENCOUNTER — Other Ambulatory Visit: Payer: Self-pay | Admitting: Family Medicine

## 2017-06-02 DIAGNOSIS — Z1231 Encounter for screening mammogram for malignant neoplasm of breast: Secondary | ICD-10-CM

## 2017-06-21 ENCOUNTER — Ambulatory Visit: Payer: Self-pay

## 2017-08-14 ENCOUNTER — Encounter (HOSPITAL_BASED_OUTPATIENT_CLINIC_OR_DEPARTMENT_OTHER): Payer: Self-pay | Admitting: Emergency Medicine

## 2017-08-14 ENCOUNTER — Emergency Department (HOSPITAL_BASED_OUTPATIENT_CLINIC_OR_DEPARTMENT_OTHER)
Admission: EM | Admit: 2017-08-14 | Discharge: 2017-08-15 | Disposition: A | Discharge status: Home / Self Care | Payer: BLUE CROSS/BLUE SHIELD | Attending: Emergency Medicine | Admitting: Emergency Medicine

## 2017-08-14 DIAGNOSIS — R21 Rash and other nonspecific skin eruption: Secondary | ICD-10-CM | POA: Diagnosis present

## 2017-08-14 DIAGNOSIS — Z79899 Other long term (current) drug therapy: Secondary | ICD-10-CM | POA: Diagnosis not present

## 2017-08-14 DIAGNOSIS — L509 Urticaria, unspecified: Secondary | ICD-10-CM | POA: Diagnosis not present

## 2017-08-14 DIAGNOSIS — Z7982 Long term (current) use of aspirin: Secondary | ICD-10-CM | POA: Diagnosis not present

## 2017-08-14 NOTE — ED Triage Notes (Addendum)
Pt presents with c/o hives to back arms legs neck . Pt noticed a couple hives yesterday but today ate some fish and started itching and having  Cough and raspy voice and hives seem to spread. PT took half a benadryl tablet tonight.

## 2017-08-15 MED ORDER — FAMOTIDINE 20 MG PO TABS: 20.0000 mg | ORAL_TABLET | Freq: Every day | ORAL | 0 refills | Status: AC

## 2017-08-15 MED ORDER — DIPHENHYDRAMINE HCL 25 MG PO CAPS: 25.0000 mg | ORAL_CAPSULE | Freq: Four times a day (QID) | ORAL | 0 refills | Status: DC | PRN

## 2017-08-15 MED ORDER — PREDNISONE 20 MG PO TABS: 40.0000 mg | ORAL_TABLET | Freq: Every day | ORAL | 0 refills | Status: DC

## 2017-08-15 MED ORDER — DIPHENHYDRAMINE HCL 25 MG PO CAPS
25.0000 mg | ORAL_CAPSULE | Freq: Once | ORAL | Status: AC
  Administered 2017-08-15: 25 mg via ORAL
  Filled 2017-08-15: qty 1

## 2017-08-15 MED ORDER — PREDNISONE 50 MG PO TABS
60.0000 mg | ORAL_TABLET | Freq: Once | ORAL | Status: AC
  Administered 2017-08-15: 60 mg via ORAL
  Filled 2017-08-15: qty 1

## 2017-08-15 MED ORDER — FAMOTIDINE 20 MG PO TABS
20.0000 mg | ORAL_TABLET | Freq: Once | ORAL | Status: AC
  Administered 2017-08-15: 20 mg via ORAL
  Filled 2017-08-15: qty 1

## 2017-08-15 NOTE — ED Provider Notes (Signed)
[facility]   CSN: [id number] Arrival date & time: 08/14/17  2317     History   Chief Complaint Chief Complaint  Patient presents with  . Rash  . Allergic Reaction    HPI [name] Santana is a 46 y.o. female.  HPI  This a 46 year old female who presents with a rash. Patient reports rash over the chest, back, arms. She reports that it is itchy. She noted a few red splotches on her chest yesterday morning. However, she had increased itching and rash this evening. She did eat a piece of sushi with [place] prior to onset of itching and rash this evening. No known fish allergy. Denies any new soaps, detergents. Denies any shortness of breath, throat swelling. She took one half of the Benadryl prior to arrival. Denies fevers or systemic symptoms.  Past Medical History:  Diagnosis Date  . Arthritis   . Pericarditis   . Vasculitis [facility])     Patient Active Problem List   Diagnosis Date Noted  . Pericarditis 04/08/2014  . Chest pain of pericarditis 04/08/2014  . ST segment elevation - pericarditis 04/08/2014  . Acute pericarditis 04/08/2014  . Vasculitis [facility])     Past Surgical History:  Procedure Laterality Date  . VARICOSE VEIN SURGERY      OB History    No data available       Home Medications    Prior to Admission medications   Medication Sig Start Date End Date Taking? Authorizing Provider  acetaminophen-codeine 120-12 MG/5ML solution Take 10 mLs by mouth every 4 (four) hours as needed for moderate pain. Patient not taking: Reported on 06/20/2016 05/28/16   Charlestine Night, PA-C  aspirin EC 81 MG tablet Take 1 tablet (81 mg total) by mouth daily. 04/11/14   Zannie Cove, MD  Aspirin-Salicylamide-Caffeine [facility] HEADACHE POWDER PO) Take 1 packet by mouth every 8 (eight) hours as needed (headache/pain).     [provider]  cholecalciferol (VITAMIN D) 1000 UNITS tablet Take 1,000 Units by mouth daily.    [provider]    colchicine 0.6 MG tablet Take 1 tablet (0.6 mg total) by mouth 2 (two) times daily. 06/20/16   Shaune Pollack, MD  diphenhydrAMINE (BENADRYL) 25 mg capsule Take 1 capsule (25 mg total) by mouth every 6 (six) hours as needed. 08/15/17   Seibert Keeter, Mayer Masker, MD  doxycycline (VIBRAMYCIN) 100 MG capsule Take 1 capsule (100 mg total) by mouth 2 (two) times daily. 05/28/16   Lawyer, Cristal Deer, PA-C  ECHINACEA PO Take 1 Dose by mouth daily.    [provider]  famotidine (PEPCID) 20 MG tablet Take 1 tablet (20 mg total) by mouth daily. 08/15/17   Ronetta Molla, Mayer Masker, MD  folic acid (FOLVITE) 1 MG tablet Take 1 mg by mouth daily.    [provider]  Garlic 100 MG TABS Take 100 mg by mouth daily.     [provider]  Ginger, Zingiber officinalis, (GINGER PO) Take 1 capsule by mouth daily.    [provider]  Guaifenesin 1200 MG TB12 Take 1 tablet (1,200 mg total) by mouth 2 (two) times daily. Patient not taking: Reported on 06/20/2016 05/28/16   Charlestine Night, PA-C  HYDROcodone-acetaminophen (NORCO/VICODIN) 5-325 MG tablet Take 1 tablet by mouth every 4 (four) hours as needed for severe pain. 06/20/16   Shaune Pollack, MD  ibuprofen (ADVIL,MOTRIN) 200 MG tablet Take 200 mg by mouth every 6 (six) hours as needed for headache, mild pain or  moderate pain.    [provider]  indomethacin (INDOCIN) 25 MG capsule Take 1 capsule (25 mg total) by mouth 3 (three) times daily with meals. 06/20/16   Shaune Pollack, MD  methotrexate 50 MG/2ML injection Inject 20 mg into the vein once a week.    [provider]  Multiple Vitamin (MULTIVITAMIN WITH MINERALS) TABS Take 1 tablet by mouth daily.    [provider]  predniSONE (DELTASONE) 20 MG tablet Take 2 tablets (40 mg total) by mouth daily. 08/15/17   Yanna Leaks, Mayer Masker, MD  ranitidine (ZANTAC) 150 MG tablet Take 1 tablet (150 mg total) by mouth daily. 06/20/16   Shaune Pollack, MD    Family  History Family History  Problem Relation Age of Onset  . Hypertension Mother   . Hyperlipidemia Mother   . Seizures Father     Social History Social History  Substance Use Topics  . Smoking status: Never Smoker  . Smokeless tobacco: Never Used  . Alcohol use 0.0 oz/week    2 - 3 Cans of beer per week     Allergies   Patient has no known allergies.   Review of Systems Review of Systems  Constitutional: Negative for fever.  Respiratory: Negative for shortness of breath and wheezing.   Cardiovascular: Negative for chest pain.  Gastrointestinal: Negative for nausea and vomiting.  Skin: Positive for rash.  All other systems reviewed and are negative.    Physical Exam Updated Vital Signs BP 115/80   Pulse 84   Temp 98.1 F (36.7 C) (Oral)   Resp 18   SpO2 97%   Physical Exam  Constitutional: She is oriented to person, place, and time. She appears well-developed and well-nourished. No distress.  HENT:  Head: Normocephalic and atraumatic.  No oropharyngeal swelling noted  Cardiovascular: Normal rate, regular rhythm and normal heart sounds.   No murmur heard. Pulmonary/Chest: Effort normal and breath sounds normal. No respiratory distress. She has no wheezes.  Abdominal: Soft. There is no tenderness.  Neurological: She is alert and oriented to person, place, and time.  Skin: Skin is warm and dry. Rash noted.  Patchy erythematous rash noted mostly over the trunk and back, slightly raised, blanching  Psychiatric: She has a normal mood and affect.  Nursing note and vitals reviewed.    ED Treatments / Results  Labs (all labs ordered are listed, but only abnormal results are displayed) Labs Reviewed - No data to display  EKG  EKG Interpretation None       Radiology No results found.  Procedures Procedures (including critical care time)  Medications Ordered in ED Medications  diphenhydrAMINE (BENADRYL) capsule 25 mg (25 mg Oral Given 08/15/17 0020)   famotidine (PEPCID) tablet 20 mg (20 mg Oral Given 08/15/17 0020)  predniSONE (DELTASONE) tablet 60 mg (60 mg Oral Given 08/15/17 0020)     Initial Impression / Assessment and Plan / ED Course  I have reviewed the triage vital signs and the nursing notes.  Pertinent labs & imaging results that were available during my care of the patient were reviewed by me and considered in my medical decision making (see chart for details).     Patient presents with a rash. Rash appearance most consistent with urticaria or hives. Patient reports worsening after fish ingestion. Could be scromboid; however, treatment is similar. Patient was given prednisone, Benadryl, and Pepcid. She does not have any signs or symptoms of anaphylaxis.  On recheck, patient reports some improvement. Rash appears to  be [employer]. She continues to be nontoxic-appearing with stable vital signs. Will discharge home with prednisone, Benadryl, and Pepcid.  After history, exam, and medical workup I feel the patient has been appropriately medically screened and is safe for discharge home. Pertinent diagnoses were discussed with the patient. Patient was given return precautions.   Final Clinical Impressions(s) / ED Diagnoses   Final diagnoses:  Hives    New Prescriptions New Prescriptions   DIPHENHYDRAMINE (BENADRYL) 25 MG CAPSULE    Take 1 capsule (25 mg total) by mouth every 6 (six) hours as needed.   FAMOTIDINE (PEPCID) 20 MG TABLET    Take 1 tablet (20 mg total) by mouth daily.   PREDNISONE (DELTASONE) 20 MG TABLET    Take 2 tablets (40 mg total) by mouth daily.     Shon Baton, MD 08/15/17 [phone]

## 2017-08-15 NOTE — ED Notes (Signed)
Pt reports she is feeling better. Rash still present but not as red. Pt resting comfortably.

## 2017-08-15 NOTE — ED Notes (Signed)
ED Provider at bedside. 

## 2017-09-29 ENCOUNTER — Emergency Department (HOSPITAL_BASED_OUTPATIENT_CLINIC_OR_DEPARTMENT_OTHER)
Admission: EM | Admit: 2017-09-29 | Discharge: 2017-09-29 | Disposition: A | Discharge status: Home / Self Care | Payer: BLUE CROSS/BLUE SHIELD | Attending: Emergency Medicine | Admitting: Emergency Medicine

## 2017-09-29 ENCOUNTER — Other Ambulatory Visit: Payer: Self-pay

## 2017-09-29 ENCOUNTER — Encounter (HOSPITAL_BASED_OUTPATIENT_CLINIC_OR_DEPARTMENT_OTHER): Payer: Self-pay

## 2017-09-29 DIAGNOSIS — Z5321 Procedure and treatment not carried out due to patient leaving prior to being seen by health care provider: Secondary | ICD-10-CM | POA: Insufficient documentation

## 2017-09-29 DIAGNOSIS — R1030 Lower abdominal pain, unspecified: Secondary | ICD-10-CM | POA: Insufficient documentation

## 2017-09-29 LAB — URINALYSIS, ROUTINE W REFLEX MICROSCOPIC
BILIRUBIN URINE: NEGATIVE
Glucose, UA: NEGATIVE mg/dL
HGB URINE DIPSTICK: NEGATIVE
Ketones, ur: NEGATIVE mg/dL
Leukocytes, UA: NEGATIVE
Nitrite: NEGATIVE
PH: 6 (ref 5.0–8.0)
Protein, ur: NEGATIVE mg/dL
SPECIFIC GRAVITY, URINE: 1.025 (ref 1.005–1.030)

## 2017-09-29 LAB — PREGNANCY, URINE: Preg Test, Ur: NEGATIVE

## 2017-09-29 NOTE — ED Triage Notes (Signed)
C/o  Lower abd and lower back pain x 2 days-pos dysuria and freq-denies n/v/d-NAD-steady gait

## 2017-09-29 NOTE — ED Notes (Addendum)
EMT brought pt back to room 7 and pt asked "How long will this take?" EMT stated "I can't give you a time frame, it's all going to depend on what the doctor orders on you." Pt then stated "Well I'm just going to leave then because I work tonight." EMT notified RN.

## 2018-10-31 ENCOUNTER — Encounter (HOSPITAL_BASED_OUTPATIENT_CLINIC_OR_DEPARTMENT_OTHER): Payer: Self-pay | Admitting: *Deleted

## 2018-10-31 ENCOUNTER — Emergency Department (HOSPITAL_BASED_OUTPATIENT_CLINIC_OR_DEPARTMENT_OTHER)
Admission: EM | Admit: 2018-10-31 | Discharge: 2018-10-31 | Disposition: A | Discharge status: Home / Self Care | Payer: Self-pay | Attending: Emergency Medicine | Admitting: Emergency Medicine

## 2018-10-31 ENCOUNTER — Emergency Department (HOSPITAL_BASED_OUTPATIENT_CLINIC_OR_DEPARTMENT_OTHER): Payer: Self-pay

## 2018-10-31 ENCOUNTER — Other Ambulatory Visit: Payer: Self-pay

## 2018-10-31 DIAGNOSIS — J069 Acute upper respiratory infection, unspecified: Secondary | ICD-10-CM | POA: Insufficient documentation

## 2018-10-31 DIAGNOSIS — B9789 Other viral agents as the cause of diseases classified elsewhere: Secondary | ICD-10-CM

## 2018-10-31 DIAGNOSIS — Z79899 Other long term (current) drug therapy: Secondary | ICD-10-CM | POA: Insufficient documentation

## 2018-10-31 DIAGNOSIS — Z7982 Long term (current) use of aspirin: Secondary | ICD-10-CM | POA: Insufficient documentation

## 2018-10-31 MED ORDER — BENZONATATE 100 MG PO CAPS: 100.0000 mg | ORAL_CAPSULE | Freq: Three times a day (TID) | ORAL | 0 refills | Status: DC | PRN

## 2018-10-31 MED FILL — BENZONATATE 100 MG CAP: 100 | 7 days supply | Qty: 21 | Fill #0

## 2018-10-31 NOTE — Discharge Instructions (Signed)
It was my pleasure taking care of you today!   Your symptoms are likely due to a viral upper respiratory infection. Fortunately, we did not see evidence of serious infection and can treat your symptoms.Flonase for nasal congestion, tessalon as needed for cough.   Rest, drink plenty of fluids to be sure you are staying hydrated.   Please follow up with your primary doctor for discussion of your diagnoses and further evaluation after today's visit if symptoms persist longer than 7 days; Return to the ER for high fevers, difficulty breathing or other concerning symptoms

## 2018-10-31 NOTE — ED Triage Notes (Signed)
Pt c/o URi symptoms x 1 week  

## 2018-10-31 NOTE — ED Notes (Signed)
Pt verbalizes understanding of d/c instructions and denies any further needs at this time. 

## 2018-10-31 NOTE — ED Provider Notes (Signed)
[facility] EMERGENCY DEPARTMENT Provider Note   CSN: [id number] Arrival date & time: 10/31/18  1605     History   Chief Complaint Chief Complaint  Patient presents with  . URI    HPI [name] Santana is a 47 y.o. female.  The history is provided by the patient and medical records. No language interpreter was used.  URI   Associated symptoms include congestion, sore throat (Resolved) and cough. Pertinent negatives include no chest pain.  [name] Santana is a 47 year old female who presents to the emergency department for improving URI symptoms and persistent cough for the last week.  Patient states that she initially had fever, congestion and sore throat, however that has been improving and nearly resolved.  She is concerned because her cough has not gone away.  She has not tried any medications for cough.  She has tried over-the-counter antipyretics with some improvement in her previous URI symptoms.  She is not a smoker.  Denies chest pain or shortness of breath.  Past Medical History:  Diagnosis Date  . Arthritis   . Pericarditis   . Vasculitis [facility])     Patient Active Problem List   Diagnosis Date Noted  . Pericarditis 04/08/2014  . Chest pain of pericarditis 04/08/2014  . ST segment elevation - pericarditis 04/08/2014  . Acute pericarditis 04/08/2014  . Vasculitis [facility])     Past Surgical History:  Procedure Laterality Date  . VARICOSE VEIN SURGERY       OB History   None      Home Medications    Prior to Admission medications   Medication Sig Start Date End Date Taking? Authorizing Provider  acetaminophen-codeine 120-12 MG/5ML solution Take 10 mLs by mouth every 4 (four) hours as needed for moderate pain. Patient not taking: Reported on 06/20/2016 05/28/16   Charlestine Night, PA-C  aspirin EC 81 MG tablet Take 1 tablet (81 mg total) by mouth daily. 04/11/14   Zannie Cove, MD  Aspirin-Salicylamide-Caffeine [facility] HEADACHE POWDER PO) Take 1  packet by mouth every 8 (eight) hours as needed (headache/pain).     [provider]  benzonatate (TESSALON) 100 MG capsule Take 1 capsule (100 mg total) by mouth 3 (three) times daily as needed for cough. 10/31/18   Mineola Duan, Chase Picket, PA-C  cholecalciferol (VITAMIN D) 1000 UNITS tablet Take 1,000 Units by mouth daily.    [provider]  colchicine 0.6 MG tablet Take 1 tablet (0.6 mg total) by mouth 2 (two) times daily. 06/20/16   Shaune Pollack, MD  diphenhydrAMINE (BENADRYL) 25 mg capsule Take 1 capsule (25 mg total) by mouth every 6 (six) hours as needed. 08/15/17   Horton, Mayer Masker, MD  doxycycline (VIBRAMYCIN) 100 MG capsule Take 1 capsule (100 mg total) by mouth 2 (two) times daily. 05/28/16   Lawyer, Cristal Deer, PA-C  ECHINACEA PO Take 1 Dose by mouth daily.    [provider]  famotidine (PEPCID) 20 MG tablet Take 1 tablet (20 mg total) by mouth daily. 08/15/17   Horton, Mayer Masker, MD  folic acid (FOLVITE) 1 MG tablet Take 1 mg by mouth daily.    [provider]  Garlic 100 MG TABS Take 100 mg by mouth daily.     [provider]  Ginger, Zingiber officinalis, (GINGER PO) Take 1 capsule by mouth daily.    [provider]  Guaifenesin 1200 MG TB12 Take 1 tablet (1,200 mg total) by mouth 2 (two) times daily. Patient not taking: Reported on  06/20/2016 05/28/16   Lawyer, Cristal Deer, PA-C  HYDROcodone-acetaminophen (NORCO/VICODIN) 5-325 MG tablet Take 1 tablet by mouth every 4 (four) hours as needed for severe pain. 06/20/16   Shaune Pollack, MD  ibuprofen (ADVIL,MOTRIN) 200 MG tablet Take 200 mg by mouth every 6 (six) hours as needed for headache, mild pain or moderate pain.    [provider]  indomethacin (INDOCIN) 25 MG capsule Take 1 capsule (25 mg total) by mouth 3 (three) times daily with meals. 06/20/16   Shaune Pollack, MD  methotrexate 50 MG/2ML injection Inject 20 mg into the vein once a week.    [provider]    Multiple Vitamin (MULTIVITAMIN WITH MINERALS) TABS Take 1 tablet by mouth daily.    [provider]  predniSONE (DELTASONE) 20 MG tablet Take 2 tablets (40 mg total) by mouth daily. 08/15/17   Horton, Mayer Masker, MD  ranitidine (ZANTAC) 150 MG tablet Take 1 tablet (150 mg total) by mouth daily. 06/20/16   Shaune Pollack, MD    Family History Family History  Problem Relation Age of Onset  . Hypertension Mother   . Hyperlipidemia Mother   . Seizures Father     Social History Social History   Tobacco Use  . Smoking status: Never Smoker  . Smokeless tobacco: Never Used  Substance Use Topics  . Alcohol use: Yes    Comment: daily  . Drug use: Yes    Types: Cocaine     Allergies   Patient has no known allergies.   Review of Systems Review of Systems  Constitutional: Positive for fever (Resolved).  HENT: Positive for congestion and sore throat (Resolved).   Respiratory: Positive for cough. Negative for shortness of breath.   Cardiovascular: Negative for chest pain.     Physical Exam Updated Vital Signs BP 131/81 (BP Location: Right Arm)   Pulse 89   Temp 98.8 F (37.1 C) (Oral)   Resp 20   Ht 5\' 7"  (1.702 m)   Wt 86.2 kg   SpO2 95%   BMI 29.76 kg/m   Physical Exam  Constitutional: She is oriented to person, place, and time. She appears well-developed and well-nourished. No distress.  HENT:  Head: Normocephalic and atraumatic.  OP with scant erythema, no tonsillar hypertrophy or exudates.  Cardiovascular: Normal rate, regular rhythm and normal heart sounds.  No murmur heard. Pulmonary/Chest: Effort normal and breath sounds normal. No respiratory distress.  Lungs clear to auscultation bilaterally.  Abdominal: Soft. She exhibits no distension. There is no tenderness.  Musculoskeletal: Normal range of motion.  Neurological: She is alert and oriented to person, place, and time.  Skin: Skin is warm and dry.  Nursing note and vitals reviewed.    ED  Treatments / Results  Labs (all labs ordered are listed, but only abnormal results are displayed) Labs Reviewed - No data to display  EKG None  Radiology Dg Chest 2 View  Result Date: 10/31/2018 CLINICAL DATA:  Cough and shortness of breath EXAM: CHEST - 2 VIEW COMPARISON:  June 20, 2016 FINDINGS: No edema or consolidation. The heart size and pulmonary vascularity are normal. No adenopathy. No pneumothorax. No bone lesions. IMPRESSION: No edema or consolidation. Electronically Signed   By: Bretta Bang III M.D.   On: 10/31/2018 17:12    Procedures Procedures (including critical care time)  Medications Ordered in ED Medications - No data to display   Initial Impression / Assessment and Plan / ED Course  I have reviewed the triage vital  signs and the nursing notes.  Pertinent labs & imaging results that were available during my care of the patient were reviewed by me and considered in my medical decision making (see chart for details).    Reather ConverseStefanie Vandivier is a 47 y.o. female who presents to ED for improving URI symptoms with persistent cough over the last week.   On exam, patient is afebrile, non-toxic appearing with a clear lung exam. Mild rhinorrhea and OP with erythema but no exudates or tonsillar hypertrophy.  CXR doubt acute findings.  Sxs today likely due to viral URI.Symptomatic home care instructions discussed. Rx for Tessalon given. PCP follow up strongly encouraged if symptoms persist. Reasons to return to ER discussed. All questions answered.   Blood pressure 131/81, pulse 89, temperature 98.8 F (37.1 C), temperature source Oral, resp. rate 20, height 5\' 7"  (1.702 m), weight 86.2 kg, SpO2 95 %.   Final Clinical Impressions(s) / ED Diagnoses   Final diagnoses:  Viral URI with cough    ED Discharge Orders         Ordered    benzonatate (TESSALON) 100 MG capsule  3 times daily PRN     10/31/18 1732           Rusty Villella, Chase PicketJaime Pilcher, PA-C 10/31/18  1853    Tegeler, Canary Brimhristopher J, MD 11/01/18 [phone]

## 2019-06-23 ENCOUNTER — Other Ambulatory Visit: Payer: Self-pay

## 2019-06-23 ENCOUNTER — Ambulatory Visit (HOSPITAL_COMMUNITY)
Admission: EM | Admit: 2019-06-23 | Discharge: 2019-06-23 | Disposition: A | Discharge status: Home / Self Care | Payer: BC Managed Care – PPO | Attending: Urgent Care | Admitting: Urgent Care

## 2019-06-23 ENCOUNTER — Encounter (HOSPITAL_COMMUNITY): Payer: Self-pay

## 2019-06-23 DIAGNOSIS — R5381 Other malaise: Secondary | ICD-10-CM | POA: Insufficient documentation

## 2019-06-23 DIAGNOSIS — R05 Cough: Secondary | ICD-10-CM | POA: Diagnosis not present

## 2019-06-23 DIAGNOSIS — R5383 Other fatigue: Secondary | ICD-10-CM | POA: Insufficient documentation

## 2019-06-23 DIAGNOSIS — Z20828 Contact with and (suspected) exposure to other viral communicable diseases: Secondary | ICD-10-CM | POA: Insufficient documentation

## 2019-06-23 DIAGNOSIS — R059 Cough, unspecified: Secondary | ICD-10-CM

## 2019-06-23 DIAGNOSIS — Z20822 Contact with and (suspected) exposure to covid-19: Secondary | ICD-10-CM

## 2019-06-23 MED ORDER — BENZONATATE 100 MG PO CAPS: 100.0000 mg | ORAL_CAPSULE | Freq: Three times a day (TID) | ORAL | 0 refills | Status: DC | PRN

## 2019-06-23 MED ORDER — PROMETHAZINE-DM 6.25-15 MG/5ML PO SYRP: 5.0000 mL | ORAL_SOLUTION | Freq: Three times a day (TID) | ORAL | 0 refills | Status: DC | PRN

## 2019-06-23 NOTE — ED Triage Notes (Addendum)
Pt present a coworker was recently diagnose with Covid-19. Pt states that she has a dry cough with nasal congestion. Symptoms started today.

## 2019-06-23 NOTE — Discharge Instructions (Signed)

## 2019-06-23 NOTE — ED Provider Notes (Signed)
MRN: [id number] DOB: May 29, 1971  Subjective:   [name] Santana is a 48 y.o. female presenting for 1 week history of mild to moderate progressive malaise.  Now she is having some sinus congestion and a dry cough.  Patient is concerned and upset because a coworker that got tested for COVID-19 turned out to be positive and just found out his results 2 weeks ago.  She has not tried any medications for relief.  Works in the Music therapist at [employer].  No Known Allergies  Past Medical History:  Diagnosis Date  . Arthritis   . Pericarditis   . Vasculitis [facility])      Past Surgical History:  Procedure Laterality Date  . VARICOSE VEIN SURGERY      Review of Systems  Constitutional: Positive for malaise/fatigue. Negative for fever.  HENT: Positive for congestion. Negative for ear pain, sinus pain and sore throat.   Eyes: Negative for blurred vision, double vision, discharge and redness.  Respiratory: Positive for cough. Negative for hemoptysis, shortness of breath and wheezing.   Cardiovascular: Negative for chest pain.  Gastrointestinal: Negative for abdominal pain, diarrhea, nausea and vomiting.  Genitourinary: Negative for dysuria, flank pain and hematuria.  Musculoskeletal: Negative for myalgias.  Skin: Negative for rash.  Neurological: Negative for dizziness, weakness and headaches.  Psychiatric/Behavioral: Negative for depression and substance abuse.    Objective:   Vitals: BP 119/74 (BP Location: Left Arm)   Pulse 92   Temp 98.1 F (36.7 C) (Oral)   Resp 16   SpO2 98%   Physical Exam Constitutional:      General: She is not in acute distress.    Appearance: Normal appearance. She is well-developed. She is not ill-appearing, toxic-appearing or diaphoretic.  HENT:     Head: Normocephalic and atraumatic.     Right Ear: Tympanic membrane and ear canal normal. No drainage or tenderness. No middle ear effusion. Tympanic membrane is not erythematous.     Left Ear: Tympanic  membrane and ear canal normal. No drainage or tenderness.  No middle ear effusion. Tympanic membrane is not erythematous.     Nose: Nose normal. No congestion or rhinorrhea.     Mouth/Throat:     Mouth: Mucous membranes are moist. No oral lesions.     Pharynx: Oropharynx is clear. No pharyngeal swelling, oropharyngeal exudate, posterior oropharyngeal erythema or uvula swelling.     Tonsils: No tonsillar exudate or tonsillar abscesses.  Eyes:     General: No scleral icterus.    Extraocular Movements: Extraocular movements intact.     Right eye: Normal extraocular motion.     Left eye: Normal extraocular motion.     Conjunctiva/sclera: Conjunctivae normal.     Pupils: Pupils are equal, round, and reactive to light.  Neck:     Musculoskeletal: Normal range of motion and neck supple.  Cardiovascular:     Rate and Rhythm: Normal rate and regular rhythm.     Pulses: Normal pulses.     Heart sounds: Normal heart sounds. No murmur. No friction rub. No gallop.   Pulmonary:     Effort: Pulmonary effort is normal. No respiratory distress.     Breath sounds: Normal breath sounds. No stridor. No wheezing, rhonchi or rales.  Lymphadenopathy:     Cervical: No cervical adenopathy.  Skin:    General: Skin is warm and dry.     Findings: No rash.  Neurological:     General: No focal deficit present.     Mental Status:  She is alert and oriented to person, place, and time.  Psychiatric:        Mood and Affect: Mood normal.        Behavior: Behavior normal.        Thought Content: Thought content normal.    Assessment and Plan :   1. Cough   2. Close Exposure to Covid-19 Virus   3. Malaise and fatigue     Likely viral infection in etiology. Counseled patient on nature of COVID-19 including modes of transmission, diagnostic testing, management and supportive care.  Offered symptomatic relief. COVID 19 testing is pending. Counseled patient on potential for adverse effects with medications  prescribed/recommended today, ER and return-to-clinic precautions discussed, patient verbalized understanding.     Wallis BambergMani, Caeli Linehan, [place] JerseyPA-C 06/23/19 1712

## 2019-06-24 LAB — NOVEL CORONAVIRUS, NAA (HOSP ORDER, SEND-OUT TO REF LAB; TAT 18-24 HRS): SARS-CoV-2, NAA: NOT DETECTED

## 2020-01-02 ENCOUNTER — Other Ambulatory Visit: Payer: BC Managed Care – PPO

## 2021-06-22 ENCOUNTER — Other Ambulatory Visit: Payer: Self-pay | Admitting: Family Medicine

## 2021-06-22 DIAGNOSIS — M7989 Other specified soft tissue disorders: Secondary | ICD-10-CM

## 2021-07-07 ENCOUNTER — Ambulatory Visit
Admission: RE | Admit: 2021-07-07 | Discharge: 2021-07-07 | Disposition: A | Discharge status: Home / Self Care | Payer: BC Managed Care – PPO | Source: Ambulatory Visit | Attending: Family Medicine | Admitting: Family Medicine

## 2021-07-07 DIAGNOSIS — M7989 Other specified soft tissue disorders: Secondary | ICD-10-CM

## 2021-07-07 MED ORDER — GADOBENATE DIMEGLUMINE 529 MG/ML IV SOLN
17.0000 mL | Freq: Once | INTRAVENOUS | Status: AC | PRN
  Administered 2021-07-07: 17 mL via INTRAVENOUS

## 2021-09-07 DIAGNOSIS — D481 Neoplasm of uncertain behavior of connective and other soft tissue: Secondary | ICD-10-CM | POA: Diagnosis not present

## 2021-09-07 DIAGNOSIS — D1724 Benign lipomatous neoplasm of skin and subcutaneous tissue of left leg: Secondary | ICD-10-CM | POA: Diagnosis not present

## 2021-09-07 DIAGNOSIS — D4989 Neoplasm of unspecified behavior of other specified sites: Secondary | ICD-10-CM | POA: Diagnosis not present

## 2022-04-02 ENCOUNTER — Emergency Department: Payer: 59

## 2022-04-02 ENCOUNTER — Inpatient Hospital Stay
Admission: EM | Admit: 2022-04-02 | Discharge: 2022-04-04 | DRG: 917 | Disposition: A | Discharge status: Home / Self Care | Payer: 59 | Attending: Internal Medicine | Admitting: Internal Medicine

## 2022-04-02 ENCOUNTER — Other Ambulatory Visit: Payer: Self-pay

## 2022-04-02 DIAGNOSIS — Z7982 Long term (current) use of aspirin: Secondary | ICD-10-CM

## 2022-04-02 DIAGNOSIS — T50901A Poisoning by unspecified drugs, medicaments and biological substances, accidental (unintentional), initial encounter: Secondary | ICD-10-CM | POA: Diagnosis not present

## 2022-04-02 DIAGNOSIS — R918 Other nonspecific abnormal finding of lung field: Secondary | ICD-10-CM | POA: Diagnosis not present

## 2022-04-02 DIAGNOSIS — Z83438 Family history of other disorder of lipoprotein metabolism and other lipidemia: Secondary | ICD-10-CM

## 2022-04-02 DIAGNOSIS — T405X1A Poisoning by cocaine, accidental (unintentional), initial encounter: Principal | ICD-10-CM | POA: Diagnosis present

## 2022-04-02 DIAGNOSIS — Z79899 Other long term (current) drug therapy: Secondary | ICD-10-CM

## 2022-04-02 DIAGNOSIS — E872 Acidosis, unspecified: Secondary | ICD-10-CM | POA: Diagnosis not present

## 2022-04-02 DIAGNOSIS — J9601 Acute respiratory failure with hypoxia: Secondary | ICD-10-CM | POA: Diagnosis present

## 2022-04-02 DIAGNOSIS — Z8249 Family history of ischemic heart disease and other diseases of the circulatory system: Secondary | ICD-10-CM

## 2022-04-02 DIAGNOSIS — J189 Pneumonia, unspecified organism: Secondary | ICD-10-CM | POA: Diagnosis present

## 2022-04-02 DIAGNOSIS — M1990 Unspecified osteoarthritis, unspecified site: Secondary | ICD-10-CM | POA: Diagnosis present

## 2022-04-02 DIAGNOSIS — F1410 Cocaine abuse, uncomplicated: Secondary | ICD-10-CM | POA: Diagnosis present

## 2022-04-02 DIAGNOSIS — J9811 Atelectasis: Secondary | ICD-10-CM | POA: Diagnosis not present

## 2022-04-02 DIAGNOSIS — I776 Arteritis, unspecified: Secondary | ICD-10-CM

## 2022-04-02 DIAGNOSIS — R0902 Hypoxemia: Secondary | ICD-10-CM | POA: Diagnosis not present

## 2022-04-02 DIAGNOSIS — F1910 Other psychoactive substance abuse, uncomplicated: Principal | ICD-10-CM

## 2022-04-02 DIAGNOSIS — R4020 Unspecified coma: Secondary | ICD-10-CM | POA: Diagnosis not present

## 2022-04-02 DIAGNOSIS — N179 Acute kidney failure, unspecified: Secondary | ICD-10-CM | POA: Diagnosis present

## 2022-04-02 DIAGNOSIS — I319 Disease of pericardium, unspecified: Secondary | ICD-10-CM | POA: Diagnosis present

## 2022-04-02 DIAGNOSIS — T50904A Poisoning by unspecified drugs, medicaments and biological substances, undetermined, initial encounter: Secondary | ICD-10-CM | POA: Diagnosis not present

## 2022-04-02 DIAGNOSIS — R0689 Other abnormalities of breathing: Secondary | ICD-10-CM | POA: Diagnosis not present

## 2022-04-02 DIAGNOSIS — Z6372 Alcoholism and drug addiction in family: Secondary | ICD-10-CM

## 2022-04-02 DIAGNOSIS — F1510 Other stimulant abuse, uncomplicated: Secondary | ICD-10-CM | POA: Diagnosis present

## 2022-04-02 DIAGNOSIS — T43621A Poisoning by amphetamines, accidental (unintentional), initial encounter: Secondary | ICD-10-CM | POA: Diagnosis present

## 2022-04-02 DIAGNOSIS — R404 Transient alteration of awareness: Secondary | ICD-10-CM | POA: Diagnosis not present

## 2022-04-02 DIAGNOSIS — Z811 Family history of alcohol abuse and dependence: Secondary | ICD-10-CM

## 2022-04-02 LAB — SALICYLATE LEVEL: Salicylate Lvl: <7 mg/dL — ABNORMAL LOW (ref 7.0–30.0)

## 2022-04-02 LAB — BASIC METABOLIC PANEL
Anion gap: 8 (ref 5–15)
BUN: 18 mg/dL (ref 6–20)
CO2: 23 mmol/L (ref 22–32)
Calcium: 9.4 mg/dL (ref 8.9–10.3)
Chloride: 112 mmol/L — ABNORMAL HIGH (ref 98–111)
Creatinine, Ser: 1.43 mg/dL — ABNORMAL HIGH (ref 0.44–1.00)
GFR, Estimated: 45 mL/min — ABNORMAL LOW (ref >60)
Glucose, Bld: 134 mg/dL — ABNORMAL HIGH (ref 70–99)
Potassium: 4 mmol/L (ref 3.5–5.1)
Sodium: 143 mmol/L (ref 135–145)

## 2022-04-02 LAB — URINE DRUG SCREEN, QUALITATIVE (ARMC ONLY)
Amphetamines, Ur Screen: POSITIVE — AB
Barbiturates, Ur Screen: NOT DETECTED
Benzodiazepine, Ur Scrn: NOT DETECTED
Cannabinoid 50 Ng, Ur ~~LOC~~: NOT DETECTED
Cocaine Metabolite,Ur ~~LOC~~: POSITIVE — AB
MDMA (Ecstasy)Ur Screen: NOT DETECTED
Methadone Scn, Ur: NOT DETECTED
Opiate, Ur Screen: NOT DETECTED
Phencyclidine (PCP) Ur S: NOT DETECTED
Tricyclic, Ur Screen: NOT DETECTED

## 2022-04-02 LAB — CBC
HCT: 44.5 % (ref 36.0–46.0)
Hemoglobin: 14.2 g/dL (ref 12.0–15.0)
MCH: 30.4 pg (ref 26.0–34.0)
MCHC: 31.9 g/dL (ref 30.0–36.0)
MCV: 95.3 fL (ref 80.0–100.0)
Platelets: 340 10*3/uL (ref 150–400)
RBC: 4.67 MIL/uL (ref 3.87–5.11)
RDW: 12.9 % (ref 11.5–15.5)
WBC: 22.6 10*3/uL — ABNORMAL HIGH (ref 4.0–10.5)
nRBC: 0 % (ref 0.0–0.2)

## 2022-04-02 LAB — HIV ANTIBODY (ROUTINE TESTING W REFLEX): HIV Screen 4th Generation wRfx: NONREACTIVE

## 2022-04-02 LAB — LACTIC ACID, PLASMA
Lactic Acid, Venous: 2 mmol/L (ref 0.5–1.9)
Lactic Acid, Venous: 2.7 mmol/L (ref 0.5–1.9)

## 2022-04-02 LAB — ACETAMINOPHEN LEVEL: Acetaminophen (Tylenol), Serum: <10 ug/mL — ABNORMAL LOW (ref 10–30)

## 2022-04-02 LAB — GLUCOSE, CAPILLARY: Glucose-Capillary: 178 mg/dL — ABNORMAL HIGH (ref 70–99)

## 2022-04-02 MED ORDER — NALOXONE HCL 0.4 MG/ML IJ SOLN
0.4000 mg | INTRAMUSCULAR | Status: DC | PRN
  Administered 2022-04-02: 0.4 mg via INTRAVENOUS

## 2022-04-02 MED ORDER — ONDANSETRON HCL 4 MG PO TABS
4.0000 mg | ORAL_TABLET | Freq: Four times a day (QID) | ORAL | Status: DC | PRN
Start: 2022-04-02 — End: 2022-04-04

## 2022-04-02 MED ORDER — AZITHROMYCIN 250 MG PO TABS
250.0000 mg | ORAL_TABLET | Freq: Every day | ORAL | Status: DC
  Administered 2022-04-03 – 2022-04-04 (×2): 250 mg via ORAL
  Filled 2022-04-02 (×2): qty 1

## 2022-04-02 MED ORDER — ENOXAPARIN SODIUM 40 MG/0.4ML IJ SOSY
40.0000 mg | PREFILLED_SYRINGE | INTRAMUSCULAR | Status: DC
  Administered 2022-04-02 – 2022-04-03 (×2): 40 mg via SUBCUTANEOUS
  Filled 2022-04-02 (×2): qty 0.4

## 2022-04-02 MED ORDER — SODIUM CHLORIDE 0.9 % IV SOLN
1000.0000 mL | Freq: Once | INTRAVENOUS | Status: AC
  Administered 2022-04-02: 1000 mL via INTRAVENOUS

## 2022-04-02 MED ORDER — FAMOTIDINE 20 MG PO TABS
20.0000 mg | ORAL_TABLET | Freq: Every day | ORAL | Status: DC
  Administered 2022-04-02 – 2022-04-04 (×3): 20 mg via ORAL
  Filled 2022-04-02 (×3): qty 1

## 2022-04-02 MED ORDER — ONDANSETRON HCL 4 MG/2ML IJ SOLN
4.0000 mg | Freq: Four times a day (QID) | INTRAMUSCULAR | Status: DC | PRN
Start: 2022-04-02 — End: 2022-04-04

## 2022-04-02 MED ORDER — ADULT MULTIVITAMIN W/MINERALS CH
1.0000 | ORAL_TABLET | Freq: Every day | ORAL | Status: DC
  Administered 2022-04-02 – 2022-04-04 (×3): 1 via ORAL
  Filled 2022-04-02 (×3): qty 1

## 2022-04-02 MED ORDER — FOLIC ACID 1 MG PO TABS
1.0000 mg | ORAL_TABLET | Freq: Every day | ORAL | Status: DC
  Administered 2022-04-02 – 2022-04-04 (×3): 1 mg via ORAL
  Filled 2022-04-02 (×3): qty 1

## 2022-04-02 MED ORDER — THIAMINE HCL 100 MG PO TABS
100.0000 mg | ORAL_TABLET | Freq: Every day | ORAL | Status: DC
  Administered 2022-04-02 – 2022-04-04 (×3): 100 mg via ORAL
  Filled 2022-04-02 (×2): qty 1

## 2022-04-02 MED ORDER — SODIUM CHLORIDE 0.9 % IV SOLN: INTRAVENOUS | Status: DC

## 2022-04-02 MED ORDER — SODIUM CHLORIDE 0.9 % IV SOLN
500.0000 mg | Freq: Once | INTRAVENOUS | Status: AC
  Administered 2022-04-02: 500 mg via INTRAVENOUS
  Filled 2022-04-02: qty 5

## 2022-04-02 MED ORDER — SODIUM CHLORIDE 0.9 % IV SOLN: 1.0000 g | Freq: Once | INTRAVENOUS | Status: DC

## 2022-04-02 MED ORDER — ACETAMINOPHEN 325 MG PO TABS: 650.0000 mg | ORAL_TABLET | Freq: Four times a day (QID) | ORAL | Status: DC | PRN

## 2022-04-02 MED ORDER — SODIUM CHLORIDE 0.9 % IV SOLN
2.0000 g | INTRAVENOUS | Status: DC
  Administered 2022-04-02 – 2022-04-03 (×2): 2 g via INTRAVENOUS
  Filled 2022-04-02: qty 2
  Filled 2022-04-02 (×2): qty 20

## 2022-04-02 MED ORDER — ACETAMINOPHEN 650 MG RE SUPP: 650.0000 mg | Freq: Four times a day (QID) | RECTAL | Status: DC | PRN

## 2022-04-02 NOTE — Progress Notes (Signed)
Pt comes to ED from [facility]. Complains of OD. Ice, Xanex and alcohol visualized in the room. Pt received 2.5 mg in the field. LSN 5am today. Bagged for 5-6 mins in the field. Sinus tachy; 94% with 2L O2. 137/84 ?

## 2022-04-02 NOTE — Progress Notes (Signed)
Police office came to have the pt sign the consent for the incident that happened in the hotel, when RN and police went to the pt room, pt was sleeping. RN woke-up the pt but was not able to, she was unresponsive. Rapid response team was called. VS was taken, her SPO2 was significantly low at 70's, non-rebreather was applied. Narcan was given by rapid team, pt was able to response in few minutes time. VS was take and back to baseline, on room air. MD was called and assessed the pt. Pt also verbalized that she used Cocaine. Tele-sitter was activated and continuous pulse ox was used together with telemetry. Will continue to monitor pt. ?

## 2022-04-02 NOTE — Assessment & Plan Note (Addendum)
Creatinine 1.43 on presentation down to 0.83. ?

## 2022-04-02 NOTE — Assessment & Plan Note (Signed)
Likely from overdose.  IV fluid hydration. ?

## 2022-04-02 NOTE — ED Provider Notes (Signed)
? ?[facility] ?Provider Note ? ? ? Event Date/Time  ? First MD Initiated Contact with Patient 04/02/22 [phone]   ?  (approximate) ? ? ?History  ? ?Accidental overdose ? ?HPI ? ?[name] Santana is a 51 y.o. female brought in by EMS after reported overdose.  They were called to [facility], patient last seen normal at 5 AM, when they got there decreased respirations they did do bag-valve-mask while they administered intranasal Narcan with rapid improvement.  Patient is not willing to state what she took but reports she was not trying to hurt herself.  EMS reports that people that were there noted that Xanax, "ice ", "possible heroin " ?  ? ? ?Physical Exam  ? ?Triage Vital Signs: ?ED Triage Vitals  ?Enc Vitals Group  ?   BP   ?   Pulse   ?   Resp   ?   Temp   ?   Temp src   ?   SpO2   ?   Weight   ?   Height   ?   Head Circumference   ?   Peak Flow   ?   Pain Score   ?   Pain Loc   ?   Pain Edu?   ?   Excl. in GC?   ? ? ?Most recent vital signs: ?Vitals:  ? 04/02/22 0900  ?BP: 124/84  ?Pulse: 98  ?Resp: 20  ?SpO2: 96%  ? ? ? ?General: Awake, no distress.  ?CV:  Good peripheral perfusion.  Regular rate and rhythm ?Resp:  Normal effort.  Clear to auscultation bilaterally ?Abd:  No distention.  ?Other:  No clear needle marks ? ? ?ED Results / Procedures / Treatments  ? ?Labs ?(all labs ordered are listed, but only abnormal results are displayed) ?Labs Reviewed  ?CBC - Abnormal; Notable for the following components:  ?    Result Value  ? WBC 22.6 (*)   ? All other components within normal limits  ?BASIC METABOLIC PANEL - Abnormal; Notable for the following components:  ? Chloride 112 (*)   ? Glucose, Bld 134 (*)   ? Creatinine, Ser 1.43 (*)   ? GFR, Estimated 45 (*)   ? All other components within normal limits  ?URINE DRUG SCREEN, QUALITATIVE ([facility] ONLY) - Abnormal; Notable for the following components:  ? Amphetamines, Ur Screen POSITIVE (*)   ? Cocaine Metabolite,Ur [place] POSITIVE (*)   ? All  other components within normal limits  ?ACETAMINOPHEN LEVEL - Abnormal; Notable for the following components:  ? Acetaminophen (Tylenol), Serum <10 (*)   ? All other components within normal limits  ?CULTURE, BLOOD (ROUTINE X 2)  ?CULTURE, BLOOD (ROUTINE X 2)  ?SALICYLATE LEVEL  ?LACTIC ACID, PLASMA  ?LACTIC ACID, PLASMA  ? ? ? ?EKG ? ?ED ECG REPORT ?I, Jene Every, the attending physician, personally viewed and interpreted this ECG. ? ?Date: 04/02/2022 ? ?Rhythm: normal sinus rhythm ?QRS Axis: normal ?Intervals: normal ?ST/T Wave abnormalities: Nonspecific ST changes likely due to baseline wander ?Narrative Interpretation: no evidence of acute ischemia ? ? ? ?RADIOLOGY ?Chest x-ray with bibasilar likely atelectasis ? ? ? ?PROCEDURES: ? ?Critical Care performed: yes ? ?CRITICAL CARE ?Performed by: Jene Every ? ? ?Total critical care time: 30 minutes ? ?Critical care time was exclusive of separately billable procedures and treating other patients. ? ?Critical care was necessary to treat or prevent imminent or life-threatening deterioration. ? ?Critical care was time spent personally  by me on the following activities: development of treatment plan with patient and/or surrogate as well as nursing, discussions with consultants, evaluation of patient's response to treatment, examination of patient, obtaining history from patient or surrogate, ordering and performing treatments and interventions, ordering and review of laboratory studies, ordering and review of radiographic studies, pulse oximetry and re-evaluation of patient's condition. ? ? ?.1-3 Lead EKG Interpretation ?Performed by: Jene Every, MD ?Authorized by: Jene Every, MD  ? ?  Interpretation: normal   ?  ECG rate assessment: normal   ?  Rhythm: sinus rhythm   ?  Ectopy: none   ?  Conduction: normal   ? ? ?MEDICATIONS ORDERED IN ED: ?Medications  ?cefTRIAXone (ROCEPHIN) 1 g in sodium chloride 0.9 % 100 mL IVPB (has no administration in time range)   ?azithromycin (ZITHROMAX) 500 mg in sodium chloride 0.9 % 250 mL IVPB (has no administration in time range)  ?0.9 %  sodium chloride infusion (1,000 mLs Intravenous New Bag/Given 04/02/22 0902)  ? ? ? ?IMPRESSION / MDM / ASSESSMENT AND PLAN / ED COURSE  ?I reviewed the triage vital signs and the nursing notes. ? ?Patient presents with acute illness that poses threat to life or bodily function ? ?Patient presents after likely polysubstance accidental overdose.  Found to be hypoxic by EMS upon arrival.  Was given intranasal Narcan with improvement in mental status and respirations. ? ?Differential includes polysubstance overdose, narcotic overdose, electrolyte abnormality, infection, sepsis ? ?We will obtain labs, placed on the cardiac monitor ? ?Lab work notable for white blood cell count of 22,000, chest x-ray demonstrates bibasilar atelectasis versus infiltrate, will obtain CT chest ? ?UDS positive for cocaine and amphetamines ? ?CT scan is consistent with likely pneumonia, multifocal given elevated white blood cell count and hypoxia.  Patient started on nasal cannula for oxygen saturations in the high 80s. ? ?I will discuss with the hospitalist for admission ? ? ? ?  ? ? ?FINAL CLINICAL IMPRESSION(S) / ED DIAGNOSES  ? ?Final diagnoses:  ?Polysubstance abuse (HCC)  ?Accidental drug overdose, initial encounter  ?Community acquired pneumonia, unspecified laterality  ?Acute respiratory failure with hypoxia (HCC)  ? ? ? ?Rx / DC Orders  ? ?ED Discharge Orders   ? ? None  ? ?  ? ? ? ?Note:  This document was prepared using Dragon voice recognition software and may include unintentional dictation errors. ?  ?Jene Every, MD ?04/02/22 1052 ? ?

## 2022-04-02 NOTE — Assessment & Plan Note (Addendum)
CT scan showing multifocal pneumonia.  3 days of Rocephin and Zithromax given here in the hospital.  We will give another 2 days of Omnicef and Zithromax and prednisone upon going home.  I did prescribe these into medication management for her to pick up tomorrow. ?

## 2022-04-02 NOTE — ED Triage Notes (Signed)
RN at bedside. Pt actively vomiting.  ?

## 2022-04-02 NOTE — Progress Notes (Signed)
Rapid response called.  Patient given another dose of Narcan and woke up afterwards.  Patient felt a little nauseous and little shaky. ? ?Urine toxicology negative for opioids. ?We will send off urine fentanyl.  I will have to collect another urine and it does take 2 to 4 days to result back. ? ?Continue close monitoring.  Add on a continuous pulse ox and a safety telemetry sitter. ? ?Case discussed with rapid response team and nurse taking care of the patient. ? ?Patient refused me calling her mother ? ?Dr. Alford Highland ? ? ?

## 2022-04-02 NOTE — ED Notes (Signed)
Informed RN bed assigned 

## 2022-04-02 NOTE — Assessment & Plan Note (Addendum)
Patient states that she has a history of vasculitis and is supposed to be on methotrexate but has not taken in a while.  Will need follow-up as outpatient for this. ?

## 2022-04-02 NOTE — Significant Event (Signed)
Rapid Response Event Note  ? ?Reason for Call :  ?unrepsonsive ? ?Initial Focused Assessment:  ?Rapid response RN arrived in patient's room with patient lying in bed surrounded by [facility] staff. Pharmacist retrieving narcan. Patient initally with very shallow breaths, then without visible breaths. RT initially placed a non breather and then switched to ambu bag, while narcan being retrieved and drawn up. Vital signs on dinamap when rapid response entered showed BP with SBP of 126 and HR in the 80s. ? ?Interventions:  ?Narcan given. See eMAR. Post narcan patient aroused quickly and was able to speak in complete sentences. Slightly drowsy still but unlabored breathing, BP with SBP of 122, RR 16. Pulse oximetry 100% on room air. Patient able to say her name and birthday and that she was at [facility]. Patient unable to state what she last remembered but when prompted about potential events she stated she did remember her mom visiting. She denied taking any medications or illicit substances when asked. ? ?Plan of Care:  ?Patient to remain on 2C for now. Patient placed on continuous pulse oximetry in addition to her cardiac monitoring. Telesitter being obtained for further observation of patient. Dr. Renae Gloss came to see patient and is reviewing patient's orders. ? ?Event Summary:  ? ?MD Notified: Dr. Renae Gloss ?Call Time: 14:44 ?Arrival Time: 14:46 ?End Time: 15:05 ? ?Bennie Dallas, RN ?

## 2022-04-02 NOTE — Assessment & Plan Note (Addendum)
The patient required 2 doses of Narcan yesterday.  Since opioids are negative in the urine toxicology, I  suspected fentanyl.  Urine fentanyl sent off but takes 2 to 4 days to come back.  Mental status back to normal.  Asked pharmacist to give the patient a naloxone nasal spray in case. ?

## 2022-04-02 NOTE — Progress Notes (Addendum)
Recheck on patient after earlier rapid response. Patient appeared sleeping when rapid response RN arrived in room (but was breathing). Aroused to voice and gentle tap on shoulder. Lethargic but HR steady in 90s. RR in mid teens. When checked pulse ox, patient had just moved hand, but when pt's hand steady oxygen saturations low to mid 90s. ?

## 2022-04-02 NOTE — H&P (Signed)
?History and Physical  ? ? ?Patient: [name] Santana [id number] DOB: 29-Jan-1971 ?DOA: 04/02/2022 ?DOS: the patient was seen and examined on 04/02/2022 ?PCP: Patient, No Pcp Per (Inactive)  ?Patient coming from: Homeless ? ?Chief Complaint:  ?Chief Complaint  ?Patient presents with  ? Drug Overdose  ? ?HPI: [name] Santana is a 51 y.o. female with medical history significant of past medical history of vasculitis.  The patient does not remember what is going on.  She just she OD'd.  She does snort cocaine.  She does drink some occasional alcohol.  She has never gone through withdrawal.  In the ER was given Narcan and she woke up a little bit.  Patient was found to have pneumonia bilaterally by CAT scan and an elevated white count.  Hospitalist services contacted for evaluation. ? ?Review of Systems: Review of Systems  ?Constitutional:  Positive for chills, malaise/fatigue and weight loss. Negative for fever.  ?HENT:  Negative for congestion and hearing loss.   ?Eyes:  Negative for blurred vision.  ?Respiratory:  Positive for cough and shortness of breath.   ?Cardiovascular:  Positive for chest pain.  ?Gastrointestinal:  Positive for nausea. Negative for abdominal pain, constipation, diarrhea and vomiting.  ?Genitourinary:  Negative for dysuria.  ?Musculoskeletal:  Positive for joint pain.  ?Skin:  Negative for rash.  ?Neurological:  Negative for dizziness.  ?Psychiatric/Behavioral:  Positive for depression. Negative for suicidal ideas.    ?Past Medical History:  ?Diagnosis Date  ? Arthritis   ? Pericarditis   ? Vasculitis ([place])   ? ?Past Surgical History:  ?Procedure Laterality Date  ? VARICOSE VEIN SURGERY    ? ?Social History:  reports that she has never smoked. She has never used smokeless tobacco. She reports current alcohol use. She reports current drug use. Drug: Cocaine. ? ?No Known Allergies ? ?Family History  ?Problem Relation Age of Onset  ? Hypertension Mother   ? Hyperlipidemia Mother   ? Seizures  Father   ? Alcoholism Father   ? ? ?Prior to Admission medications   ?Medication Sig Start Date End Date Taking? Authorizing Provider  ?aspirin EC 81 MG tablet Take 1 tablet (81 mg total) by mouth daily. 04/11/14   Domenic Polite, MD  ?Aspirin-Salicylamide-Caffeine [facility] HEADACHE POWDER PO) Take 1 packet by mouth every 8 (eight) hours as needed (headache/pain).     [provider]  ?cholecalciferol (VITAMIN D) 1000 UNITS tablet Take 1,000 Units by mouth daily.    [provider]  ?diphenhydrAMINE (BENADRYL) 25 mg capsule Take 1 capsule (25 mg total) by mouth every 6 (six) hours as needed. 08/15/17   Horton, Barbette Hair, MD  ?ECHINACEA PO Take 1 Dose by mouth daily.    [provider]  ?famotidine (PEPCID) 20 MG tablet Take 1 tablet (20 mg total) by mouth daily. 08/15/17   Horton, Barbette Hair, MD  ?folic acid (FOLVITE) 1 MG tablet Take 1 mg by mouth daily.    [provider]  ?Garlic 123XX123 MG TABS Take 100 mg by mouth daily.     [provider]  ?Ginger, Zingiber officinalis, (GINGER PO) Take 1 capsule by mouth daily.    [provider]  ?ibuprofen (ADVIL,MOTRIN) 200 MG tablet Take 200 mg by mouth every 6 (six) hours as needed for headache, mild pain or moderate pain.    [provider]  ?Multiple Vitamin (MULTIVITAMIN WITH MINERALS) TABS Take 1 tablet by mouth daily.    [provider]  ?Medication reconciliation still undergoing.  Patient states that she does not take any medications. ? ?Physical Exam: ?Vitals:  ? 04/02/22 0838 04/02/22 0900 04/02/22 1100  ?BP:  124/84 120/76  ?Pulse:  98 85  ?Resp:  20 (!) 21  ?SpO2:  96% 92%  ?Weight: 80.3 kg    ?Height: 5\' 9"  (1.753 m)    ? ?Physical Exam ?HENT:  ?   Head: Normocephalic.  ?   Mouth/Throat:  ?   Pharynx: No oropharyngeal exudate.  ?Eyes:  ?   General: Lids are normal.  ?   Conjunctiva/sclera: Conjunctivae normal.  ?Cardiovascular:  ?   Rate and Rhythm: Normal rate and regular rhythm.  ?   Heart  sounds: Normal heart sounds, S1 normal and S2 normal.  ?Pulmonary:  ?   Breath sounds: Examination of the right-lower field reveals decreased breath sounds and rhonchi. Examination of the left-lower field reveals decreased breath sounds and rhonchi. Decreased breath sounds and rhonchi present. No wheezing or rales.  ?Abdominal:  ?   Palpations: Abdomen is soft.  ?   Tenderness: There is no abdominal tenderness.  ?Musculoskeletal:  ?   Right lower leg: No swelling.  ?   Left lower leg: No swelling.  ?Skin: ?   General: Skin is warm.  ?   Findings: No rash.  ?Neurological:  ?   Mental Status: She is lethargic.  ?  ?Data Reviewed: ?White blood cell count 22.6, lactic acid 2.0, creatinine 1.43, CT scan of the chest showing bibasilar opacities right greater than left concerning for edema or multifocal pneumonia ? ?Assessment and Plan: ?* Overdose ?Patient very vague on what she overdosed on.  States that he did snort cocaine.  Amphetamine is also positive in her system.  ER physician stated that the patient did wake up with some Narcan.  No suicidal ideation. ? ?CAP (community acquired pneumonia) ?Patient was found to have a high white count and CT scan showed pneumonia bilaterally.  Started on Rocephin and Zithromax. ? ?AKI (acute kidney injury) ([place]) ?Creatinine 1.43 on presentation.  IV fluid hydration.  No recent labs. ? ?Lactic acidosis ?Likely from overdose.  IV fluid hydration. ? ?Vasculitis ([place]) ?Patient states that she has a history of vasculitis and is supposed to be on methotrexate but has not taken in a while. ? ? ? ? ? Advance Care Planning:   Code Status: Full Code  ? ?Consults: None ? ?Severity of Illness: ?The appropriate patient status for this patient is OBSERVATION. Observation status is judged to be reasonable and necessary in order to provide the required intensity of service to ensure the patient's safety. The patient's presenting symptoms, physical exam findings, and initial radiographic and  laboratory data in the context of their medical condition is felt to place them at decreased risk for further clinical deterioration. Furthermore, it is anticipated that the patient will be medically stable for discharge from the hospital within 2 midnights of admission.  ? ?Author: ?Loletha Grayer, MD ?04/02/2022 12:17 PM ? ?For on call review [email or web address].  ?

## 2022-04-02 NOTE — Progress Notes (Signed)
?   04/02/22 1444  ?Clinical Encounter Type  ?Visited With Patient;Health care provider  ?Visit Type Initial;Code  ? ?Chaplain responded to rapid. No family present. Follow up info given to care team to connect with Chaplain if needed.  ?

## 2022-04-03 DIAGNOSIS — E872 Acidosis, unspecified: Secondary | ICD-10-CM

## 2022-04-03 DIAGNOSIS — J188 Other pneumonia, unspecified organism: Secondary | ICD-10-CM | POA: Diagnosis present

## 2022-04-03 DIAGNOSIS — F1410 Cocaine abuse, uncomplicated: Secondary | ICD-10-CM | POA: Diagnosis not present

## 2022-04-03 DIAGNOSIS — Z7982 Long term (current) use of aspirin: Secondary | ICD-10-CM | POA: Diagnosis not present

## 2022-04-03 DIAGNOSIS — J189 Pneumonia, unspecified organism: Secondary | ICD-10-CM | POA: Diagnosis not present

## 2022-04-03 DIAGNOSIS — Z8249 Family history of ischemic heart disease and other diseases of the circulatory system: Secondary | ICD-10-CM | POA: Diagnosis not present

## 2022-04-03 DIAGNOSIS — F1910 Other psychoactive substance abuse, uncomplicated: Secondary | ICD-10-CM

## 2022-04-03 DIAGNOSIS — F1510 Other stimulant abuse, uncomplicated: Secondary | ICD-10-CM | POA: Diagnosis not present

## 2022-04-03 DIAGNOSIS — J9601 Acute respiratory failure with hypoxia: Secondary | ICD-10-CM

## 2022-04-03 DIAGNOSIS — I319 Disease of pericardium, unspecified: Secondary | ICD-10-CM | POA: Diagnosis not present

## 2022-04-03 DIAGNOSIS — R69 Illness, unspecified: Secondary | ICD-10-CM | POA: Diagnosis not present

## 2022-04-03 DIAGNOSIS — I776 Arteritis, unspecified: Secondary | ICD-10-CM

## 2022-04-03 DIAGNOSIS — Z811 Family history of alcohol abuse and dependence: Secondary | ICD-10-CM | POA: Diagnosis not present

## 2022-04-03 DIAGNOSIS — T405X1A Poisoning by cocaine, accidental (unintentional), initial encounter: Secondary | ICD-10-CM | POA: Diagnosis present

## 2022-04-03 DIAGNOSIS — N179 Acute kidney failure, unspecified: Secondary | ICD-10-CM | POA: Diagnosis not present

## 2022-04-03 DIAGNOSIS — Z6372 Alcoholism and drug addiction in family: Secondary | ICD-10-CM | POA: Diagnosis not present

## 2022-04-03 DIAGNOSIS — Z79899 Other long term (current) drug therapy: Secondary | ICD-10-CM | POA: Diagnosis not present

## 2022-04-03 DIAGNOSIS — T43621A Poisoning by amphetamines, accidental (unintentional), initial encounter: Secondary | ICD-10-CM | POA: Diagnosis present

## 2022-04-03 DIAGNOSIS — T50901S Poisoning by unspecified drugs, medicaments and biological substances, accidental (unintentional), sequela: Secondary | ICD-10-CM | POA: Diagnosis not present

## 2022-04-03 DIAGNOSIS — M1990 Unspecified osteoarthritis, unspecified site: Secondary | ICD-10-CM | POA: Diagnosis not present

## 2022-04-03 DIAGNOSIS — Z83438 Family history of other disorder of lipoprotein metabolism and other lipidemia: Secondary | ICD-10-CM | POA: Diagnosis not present

## 2022-04-03 LAB — BASIC METABOLIC PANEL
Anion gap: 3 — ABNORMAL LOW (ref 5–15)
BUN: 9 mg/dL (ref 6–20)
CO2: 24 mmol/L (ref 22–32)
Calcium: 8.9 mg/dL (ref 8.9–10.3)
Chloride: 111 mmol/L (ref 98–111)
Creatinine, Ser: 0.83 mg/dL (ref 0.44–1.00)
GFR, Estimated: >60 mL/min (ref >60)
Glucose, Bld: 111 mg/dL — ABNORMAL HIGH (ref 70–99)
Potassium: 4 mmol/L (ref 3.5–5.1)
Sodium: 138 mmol/L (ref 135–145)

## 2022-04-03 LAB — CBC
HCT: 36.3 % (ref 36.0–46.0)
Hemoglobin: 11.9 g/dL — ABNORMAL LOW (ref 12.0–15.0)
MCH: 31 pg (ref 26.0–34.0)
MCHC: 32.8 g/dL (ref 30.0–36.0)
MCV: 94.5 fL (ref 80.0–100.0)
Platelets: 256 10*3/uL (ref 150–400)
RBC: 3.84 MIL/uL — ABNORMAL LOW (ref 3.87–5.11)
RDW: 12.8 % (ref 11.5–15.5)
WBC: 14.5 10*3/uL — ABNORMAL HIGH (ref 4.0–10.5)
nRBC: 0 % (ref 0.0–0.2)

## 2022-04-03 MED ORDER — METHYLPREDNISOLONE SODIUM SUCC 40 MG IJ SOLR
40.0000 mg | Freq: Every day | INTRAMUSCULAR | Status: DC
Start: 2022-04-03 — End: 2022-04-04
  Administered 2022-04-03 – 2022-04-04 (×2): 40 mg via INTRAVENOUS
  Filled 2022-04-03 (×2): qty 1

## 2022-04-03 MED ORDER — IPRATROPIUM-ALBUTEROL 0.5-2.5 (3) MG/3ML IN SOLN
3.0000 mL | Freq: Three times a day (TID) | RESPIRATORY_TRACT | Status: DC
  Administered 2022-04-03 (×2): 3 mL via RESPIRATORY_TRACT
  Filled 2022-04-03 (×2): qty 3

## 2022-04-03 NOTE — Progress Notes (Signed)
?Progress Note ? ? ?Patient: [name] Santana [id number]:[id number] DOB: Apr 14, 1971 DOA: 04/02/2022     0 ?DOS: the patient was seen and examined on 04/03/2022 ? ? ?Assessment and Plan: ?* Overdose ?The patient required 2 doses of Narcan yesterday.  Since opioids are negative in the urine toxicology, I  suspected fentanyl.  Urine fentanyl sent off but takes 2 to 4 days to come back.  Mental status better today than yesterday. ? ?Acute respiratory failure with hypoxia (HCC) ?The patient had a pulse ox of 80 this morning on room air.  She was not moving much air this morning so I added steroids nebulizer treatments and incentive spirometer.  Able to come off oxygen by this afternoon. ? ?CAP (community acquired pneumonia) ?CT scan showing multifocal pneumonia.  White count came down to 14.5.  Continue Rocephin and Zithromax. ? ?Polysubstance abuse (HCC) ?Urine toxicology positive for amphetamines and cocaine. ? ?AKI (acute kidney injury) (HCC) ?Creatinine 1.43 on presentation down to 0.83. ? ?Lactic acidosis ?Likely from overdose.  IV fluid hydration. ? ?Vasculitis (HCC) ?Patient states that she has a history of vasculitis and is supposed to be on methotrexate but has not taken in a while. ? ? ? ? ?  ? ?Subjective: Patient had a pulse ox of 80% on room air this morning.  She was not moving good air on exam.  She complains of some cough and some shortness of breath.  Feeling better today than yesterday.  Admitted with overdose and found to have bilateral pneumonia. ? ?Physical Exam: ?Vitals:  ? 04/03/22 0900 04/03/22 0910 04/03/22 1340 04/03/22 1535  ?BP:    111/71  ?Pulse:    80  ?Resp:    20  ?Temp:    98.7 ?F (37.1 ?C)  ?TempSrc:      ?SpO2: (!) 82% 96% 96% 98%  ?Weight:      ?Height:      ? ?Physical Exam ?HENT:  ?   Head: Normocephalic.  ?   Mouth/Throat:  ?   Pharynx: No oropharyngeal exudate.  ?Eyes:  ?   General: Lids are normal.  ?   Conjunctiva/sclera: Conjunctivae normal.  ?Cardiovascular:  ?   Rate and Rhythm: Normal  rate and regular rhythm.  ?   Heart sounds: Normal heart sounds, S1 normal and S2 normal.  ?Pulmonary:  ?   Breath sounds: Examination of the right-middle field reveals decreased breath sounds. Examination of the left-middle field reveals decreased breath sounds. Examination of the right-lower field reveals decreased breath sounds and rhonchi. Examination of the left-lower field reveals decreased breath sounds and rhonchi. Decreased breath sounds and rhonchi present. No wheezing or rales.  ?Abdominal:  ?   Palpations: Abdomen is soft.  ?   Tenderness: There is no abdominal tenderness.  ?Musculoskeletal:  ?   Right lower leg: No swelling.  ?   Left lower leg: No swelling.  ?Skin: ?   General: Skin is warm.  ?   Findings: No rash.  ?Neurological:  ?   Mental Status: She is alert.  ?  ?Data Reviewed: ?White blood cell count 14.5, hemoglobin 11.9, creatinine 0.83 ? ?Family Communication: Updated patient's mother on the phone this morning ? ?Disposition: ?Status is: Inpatient ?Remains inpatient appropriate because: Patient had poor air entry this morning and a low pulse ox of 80%.  I added IV Solu-Medrol, nebulizer treatments and incentive spirometer. ? ?Planned Discharge Destination: Patient is in between living situations at this point ? ? ?Author: ?Alford Highland, MD ?04/03/2022 3:44  PM ? ?For on call review [email or web address].  ?

## 2022-04-03 NOTE — Progress Notes (Signed)
Discontinuing teley sitter per protocol. ? ?

## 2022-04-03 NOTE — Assessment & Plan Note (Signed)
Urine toxicology positive for amphetamines and cocaine. ?

## 2022-04-03 NOTE — Assessment & Plan Note (Addendum)
The patient had a pulse ox of 80 yesterday morning on room air.  Able to come off oxygen yesterday afternoon ?

## 2022-04-04 DIAGNOSIS — J189 Pneumonia, unspecified organism: Secondary | ICD-10-CM | POA: Diagnosis not present

## 2022-04-04 DIAGNOSIS — F1910 Other psychoactive substance abuse, uncomplicated: Secondary | ICD-10-CM | POA: Diagnosis not present

## 2022-04-04 DIAGNOSIS — T50901S Poisoning by unspecified drugs, medicaments and biological substances, accidental (unintentional), sequela: Secondary | ICD-10-CM | POA: Diagnosis not present

## 2022-04-04 DIAGNOSIS — N179 Acute kidney failure, unspecified: Secondary | ICD-10-CM

## 2022-04-04 DIAGNOSIS — J9601 Acute respiratory failure with hypoxia: Secondary | ICD-10-CM | POA: Diagnosis not present

## 2022-04-04 MED ORDER — IPRATROPIUM-ALBUTEROL 0.5-2.5 (3) MG/3ML IN SOLN: 3.0000 mL | RESPIRATORY_TRACT | Status: DC | PRN

## 2022-04-04 MED ORDER — NALOXONE HCL 4 MG/0.1ML NA LIQD: NASAL | 0 refills | Status: AC

## 2022-04-04 MED ORDER — ALBUTEROL SULFATE HFA 108 (90 BASE) MCG/ACT IN AERS
2.0000 | INHALATION_SPRAY | Freq: Four times a day (QID) | RESPIRATORY_TRACT | 0 refills | Status: AC | PRN
  Filled 2022-04-04: qty 6.7, 25d supply, fill #0

## 2022-04-04 MED ORDER — AZITHROMYCIN 250 MG PO TABS
ORAL_TABLET | ORAL | 0 refills | Status: AC
  Filled 2022-04-04: qty 2, 1d supply, fill #0

## 2022-04-04 MED ORDER — PREDNISONE 20 MG PO TABS
ORAL_TABLET | ORAL | 0 refills | Status: AC
  Filled 2022-04-04: qty 4, 2d supply, fill #0

## 2022-04-04 MED ORDER — VITAMIN B-1 100 MG PO TABS
100.0000 mg | ORAL_TABLET | Freq: Every day | ORAL | 0 refills | Status: AC
  Filled 2022-04-04: qty 30, 30d supply, fill #0

## 2022-04-04 MED ORDER — FOLIC ACID 1 MG PO TABS
1.0000 mg | ORAL_TABLET | Freq: Every day | ORAL | 0 refills | Status: AC
  Filled 2022-04-04: qty 30, 30d supply, fill #0

## 2022-04-04 MED ORDER — CEFDINIR 300 MG PO CAPS
300.0000 mg | ORAL_CAPSULE | Freq: Two times a day (BID) | ORAL | 0 refills | Status: AC
  Filled 2022-04-04: qty 4, 2d supply, fill #0

## 2022-04-04 NOTE — Discharge Summary (Signed)
?Physician Discharge Summary ?  ?Patient: [name] Santana MRN: [id number] DOB: [birth year]  ?Admit date:     04/02/2022  ?Discharge date: 04/04/22  ?Discharge Physician: Loletha Grayer  ? ?PCP: Patient, No Pcp Per (Inactive)  ? ?Recommendations at discharge:  ? ?[facility]-up [facility] ? ?Discharge Diagnoses: ?Principal Problem: ?  Overdose ?Active Problems: ?  Acute respiratory failure with hypoxia ([place]) ?  CAP (community acquired pneumonia) ?  Vasculitis ([place]) ?  Lactic acidosis ?  AKI (acute kidney injury) ([place]) ?  Multifocal pneumonia ?  Polysubstance abuse ([place]) ? ? ? ?Hospital Course: ?The patient was admitted to the hospital on 04/02/2022 and discharged on 04/04/2022.  Patient initially came in with a drug overdose.  Urine toxicology positive for amphetamines and cocaine.  Opiates were negative but the patient did respond to Narcan in the emergency room.  I sent off a urine toxicology for fentanyl but still pending at this time.  Later in the day of admission and required another dose of Narcan.  The patient was diagnosed with pneumonia on CT scan of the chest.  The patient was started on Rocephin and Zithromax and given 3 days of Rocephin and Zithromax here in the hospital.  The patient did have a pulse ox in the 80s and initially required oxygen.  I started Solu-Medrol and incentive spirometry and she was given to get better air entry and come off the oxygen.  I prescribed medications into medication management for pickup on Monday.  She received antibiotics and steroids on Sunday.  Zithromax and Rocephin for 2 more days and prednisone for 2 more days. ? ?Assessment and Plan: ?* Overdose ?The patient required 2 doses of Narcan yesterday.  Since opioids are negative in the urine toxicology, I  suspected fentanyl.  Urine fentanyl sent off but takes 2 to 4 days to come back.  Mental status back to normal.  Asked pharmacist to give the patient a naloxone nasal spray in case. ? ?Acute respiratory failure with  hypoxia ([place]) ?The patient had a pulse ox of 80 yesterday morning on room air.  Able to come off oxygen yesterday afternoon ? ?CAP (community acquired pneumonia) ?CT scan showing multifocal pneumonia.  3 days of Rocephin and Zithromax given here in the hospital.  We will give another 2 days of Omnicef and Zithromax and prednisone upon going home.  I did prescribe these into medication management for her to pick up tomorrow. ? ?Polysubstance abuse ([place]) ?Urine toxicology positive for amphetamines and cocaine. ? ?AKI (acute kidney injury) ([place]) ?Creatinine 1.43 on presentation down to 0.83. ? ?Lactic acidosis ?Likely from overdose.  IV fluid hydration. ? ?Vasculitis ([place]) ?Patient states that she has a history of vasculitis and is supposed to be on methotrexate but has not taken in a while.  Will need follow-up as outpatient for this. ? ? ? ? ?  ? ? ?Consultants: None ?Procedures performed: None ?Disposition: Patient is in between homes at this point asked TOC to give information ?Diet recommendation:  ?Regular diet ?DISCHARGE MEDICATION: ?Allergies as of 04/04/2022   ?No Known Allergies ?  ? ?  ?Medication List  ?  ? ?STOP taking these medications   ? ?BC HEADACHE POWDER PO ?  ?diphenhydrAMINE 25 mg capsule ?Commonly known as: BENADRYL ?  ?ibuprofen 200 MG tablet ?Commonly known as: ADVIL ?  ? ?  ? ?TAKE these medications   ? ?albuterol 108 (90 Base) MCG/ACT inhaler ?Commonly known as: VENTOLIN HFA ?Inhale 2 puffs into the lungs every  6 (six) hours as needed for wheezing or shortness of breath. ?  ?aspirin EC 81 MG tablet ?Take 1 tablet (81 mg total) by mouth daily. ?  ?azithromycin 250 MG tablet ?Commonly known as: ZITHROMAX ?One tab po daily for two days ?Start taking on: Apr 05, 2022 ?  ?cefdinir 300 MG capsule ?Commonly known as: OMNICEF ?Take 1 capsule (300 mg total) by mouth 2 (two) times daily for 2 days. ?Start taking on: Apr 05, 2022 ?  ?cholecalciferol 1000 units tablet ?Commonly known as: VITAMIN D ?Take  1,000 Units by mouth daily. ?  ?ECHINACEA PO ?Take 1 Dose by mouth daily. ?  ?famotidine 20 MG tablet ?Commonly known as: PEPCID ?Take 1 tablet (20 mg total) by mouth daily. ?  ?folic acid 1 MG tablet ?Commonly known as: FOLVITE ?Take 1 tablet (1 mg total) by mouth daily. ?  ?Garlic 562 MG Tabs ?Take 100 mg by mouth daily. ?  ?GINGER PO ?Take 1 capsule by mouth daily. ?  ?multivitamin with minerals Tabs tablet ?Take 1 tablet by mouth daily. ?  ?naloxone 4 MG/0.1ML Liqd nasal spray kit ?Commonly known as: NARCAN ?As needed for opiod overdose ?  ?predniSONE 20 MG tablet ?Commonly known as: DELTASONE ?Two tabs po daily for two days ?Start taking on: Apr 05, 2022 ?  ?thiamine 100 MG tablet ?Take 1 tablet (100 mg total) by mouth daily. ?Start taking on: Apr 05, 2022 ?  ? ?  ? ? Follow-up Information   ? ? [facility] Follow up.   ?Specialty: Primary Care ?Contact information: ?[address] ?[address] ?[place] ?[phone] ? ?  ?  ? ?  ?  ? ?  ? ?Discharge Exam: ?Filed Weights  ? 04/02/22 0838  ?Weight: 80.3 kg  ? ?Physical Exam ?HENT:  ?   Head: Normocephalic.  ?   Mouth/Throat:  ?   Pharynx: No oropharyngeal exudate.  ?Eyes:  ?   General: Lids are normal.  ?   Conjunctiva/sclera: Conjunctivae normal.  ?Cardiovascular:  ?   Rate and Rhythm: Normal rate and regular rhythm.  ?   Heart sounds: Normal heart sounds, S1 normal and S2 normal.  ?Pulmonary:  ?   Breath sounds: No decreased breath sounds, wheezing, rhonchi or rales.  ?Abdominal:  ?   Palpations: Abdomen is soft.  ?   Tenderness: There is no abdominal tenderness.  ?Musculoskeletal:  ?   Right lower leg: No swelling.  ?   Left lower leg: No swelling.  ?Skin: ?   General: Skin is warm.  ?   Findings: No rash.  ?Neurological:  ?   Mental Status: She is alert.  ?  ? ?Condition at discharge: fair ? ?The results of significant diagnostics from this hospitalization (including imaging, microbiology, ancillary and laboratory) are  listed below for reference.  ? ?Imaging Studies: ?CT Chest Wo Contrast ? ?Result Date: 04/02/2022 ?CLINICAL DATA:  Possible overdose. EXAM: CT CHEST WITHOUT CONTRAST TECHNIQUE: Multidetector CT imaging of the chest was performed following the standard protocol without IV contrast. RADIATION DOSE REDUCTION: This exam was performed according to the departmental dose-optimization program which includes automated exposure control, adjustment of the mA and/or kV according to patient size and/or use of iterative reconstruction technique. COMPARISON:  May 28, 2016. FINDINGS: Cardiovascular: No significant vascular findings. Normal heart size. No pericardial effusion. Mediastinum/Nodes: No enlarged mediastinal or axillary lymph nodes. Thyroid gland, trachea, and esophagus demonstrate no significant findings. Lungs/Pleura: No pneumothorax pleural effusion  is noted. Bilateral lower lobe and to some degree right lower lobe airspace and reticular opacities are noted, right greater than left, concerning for pulmonary edema or possibly multifocal pneumonia. Upper Abdomen: No acute abnormality. Musculoskeletal: No chest wall mass or suspicious bone lesions identified. IMPRESSION: Bibasilar opacities are noted, right greater than left, concerning for pulmonary edema or possibly multifocal pneumonia. Electronically Signed   By: Marijo Conception M.D.   On: 04/02/2022 10:31  ? ?DG Chest Port 1 View ? ?Result Date: 04/02/2022 ?CLINICAL DATA:  Overdose. EXAM: PORTABLE CHEST 1 VIEW COMPARISON:  10/31/2018 FINDINGS: Numerous leads and wires project over the chest. The patient is rotated minimally left. Normal heart size. No pleural effusion or pneumothorax. Vague increased density projecting over the lung bases is favored to be due to AP portable technique, poor inspiratory effort, and overlying breast tissue. Biapical pleuroparenchymal scarring is mild. IMPRESSION: Bibasilar increased density, favoring a combination of atelectasis and  overlying breast tissue. If clinical concern of pneumonia or aspiration, consider PA and lateral radiographs. Electronically Signed   By: Abigail Miyamoto M.D.   On: 04/02/2022 08:48   ? ?Microbiology: ?Results

## 2022-04-05 ENCOUNTER — Other Ambulatory Visit: Payer: Self-pay

## 2022-04-07 LAB — CULTURE, BLOOD (ROUTINE X 2)
Culture: NO GROWTH
Culture: NO GROWTH
Special Requests: ADEQUATE
Special Requests: ADEQUATE

## 2022-05-19 ENCOUNTER — Ambulatory Visit: Payer: 59 | Admitting: Nurse Practitioner

## 2022-05-21 IMAGING — CT CT CHEST W/O CM
2 of 4 series · 15 of 36 positions shown, 18 images · non-contrast
Comparison: May 28, 2016.

CLINICAL DATA: Possible overdose.



[Series 2: chest wo · axial · 0.67mm/px · z∈[+1438,+1686]mm · 12 of 148 slices shown, 15 images]
[im 12/148  mediastinal]
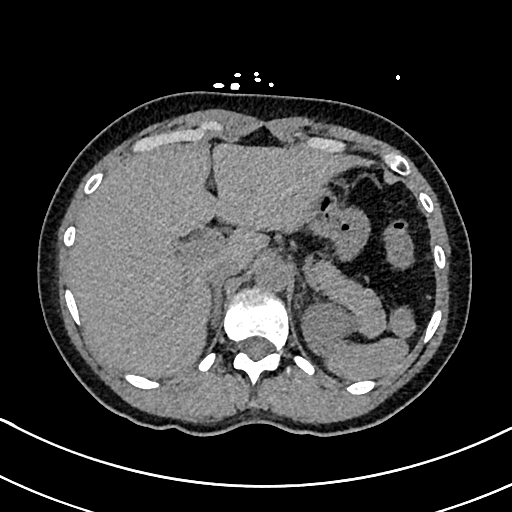
[im 12/148  lung]
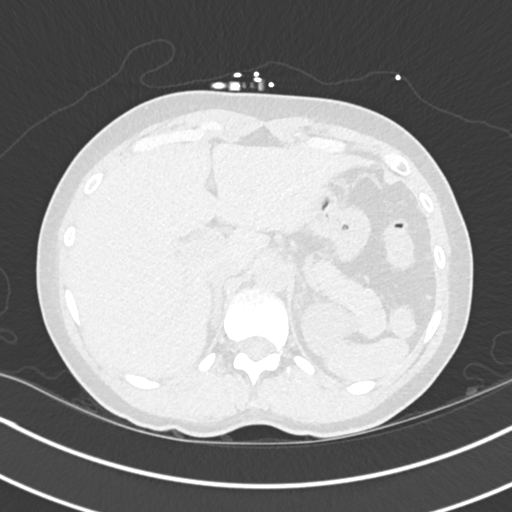
[im 23/148  lung]
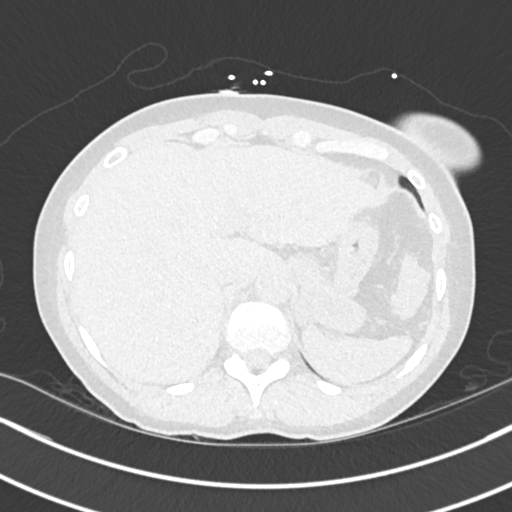
[im 34/148  lung]
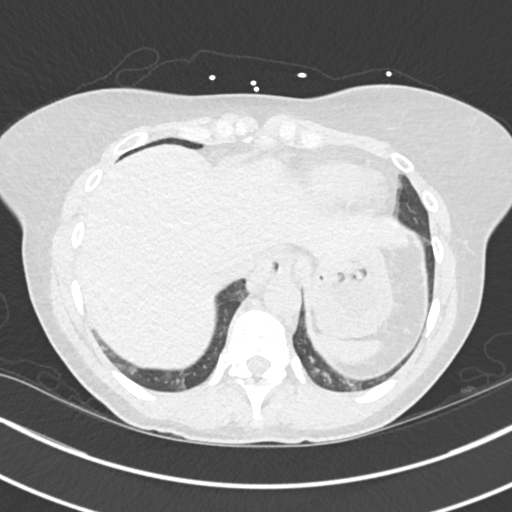
[im 46/148  lung]
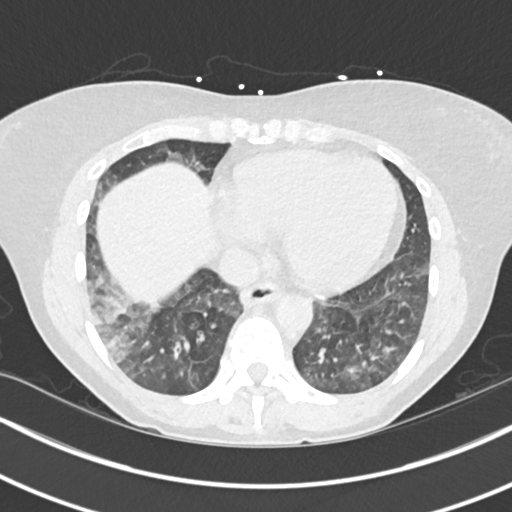
[im 57/148  mediastinal]
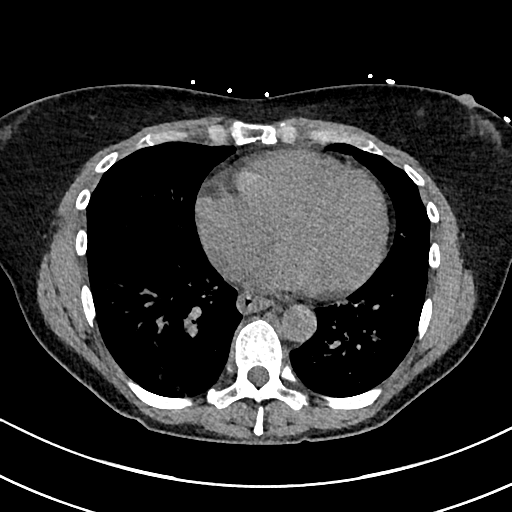
[im 57/148  lung]
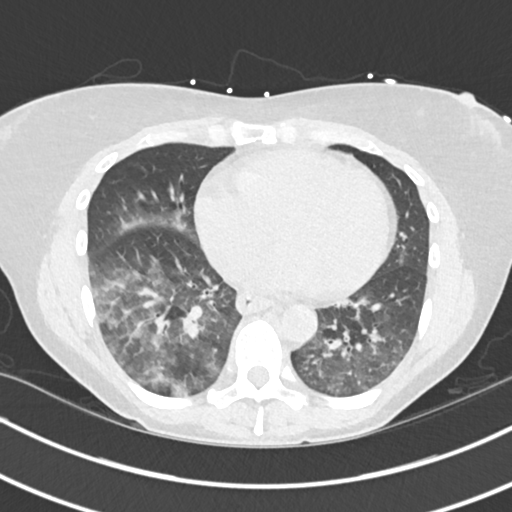
[im 68/148  lung]
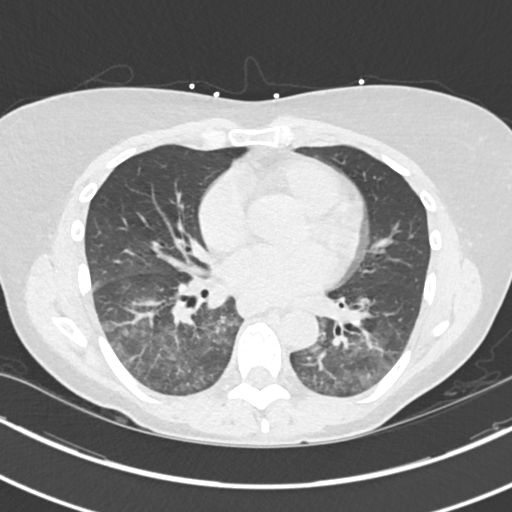
[im 80/148  lung]
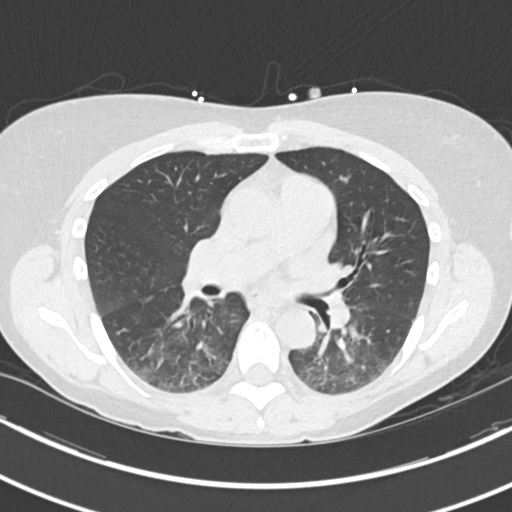
[im 91/148  lung]
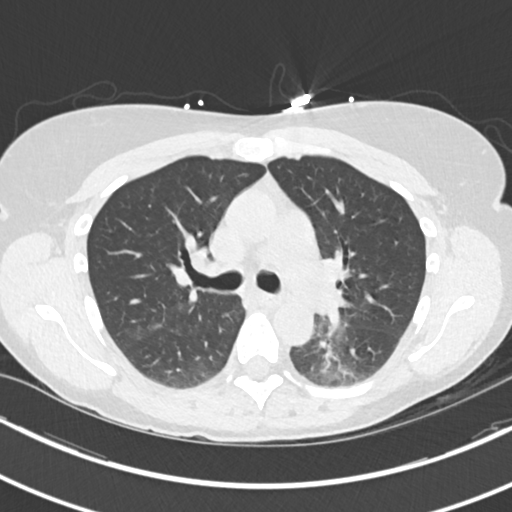
[im 102/148  mediastinal]
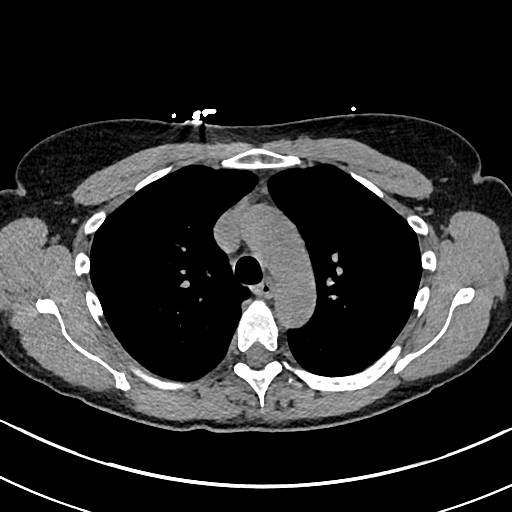
[im 102/148  lung]
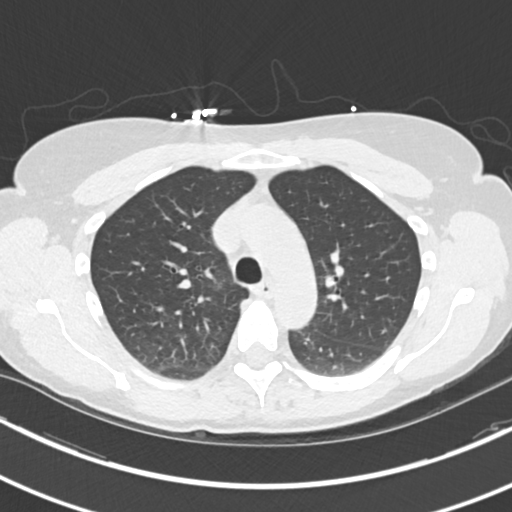
[im 114/148  lung]
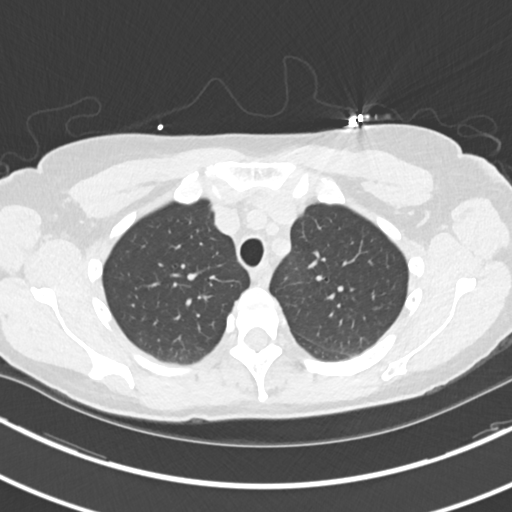
[im 125/148  lung]
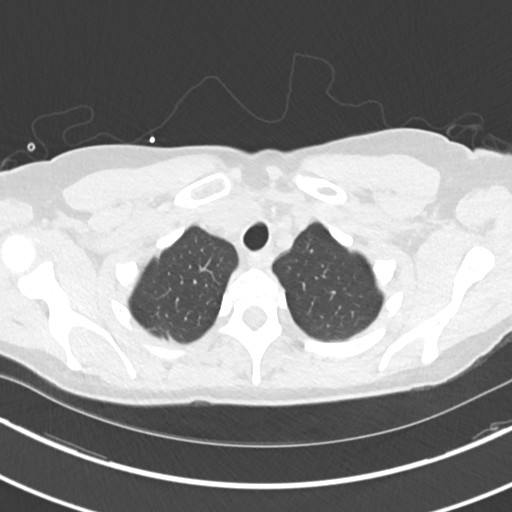
[im 136/148  lung]
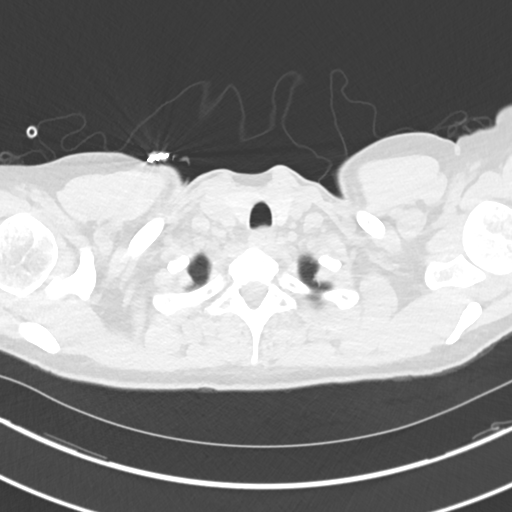

[Series 5: cor · coronal · 0.65mm/px · 3 of 127 slices shown]
[im 26/127  lung]
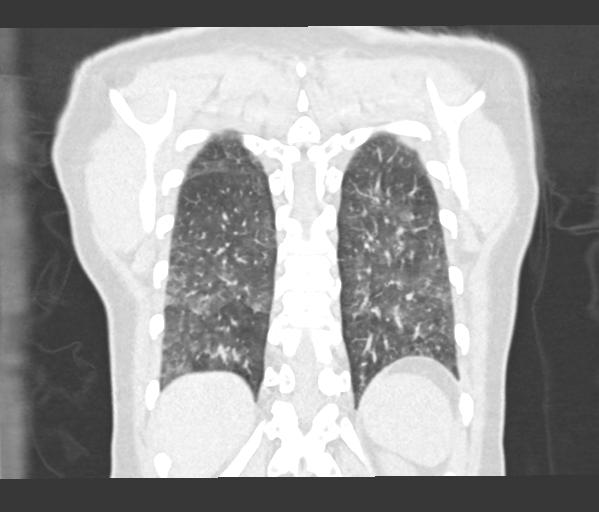
[im 51/127  lung]
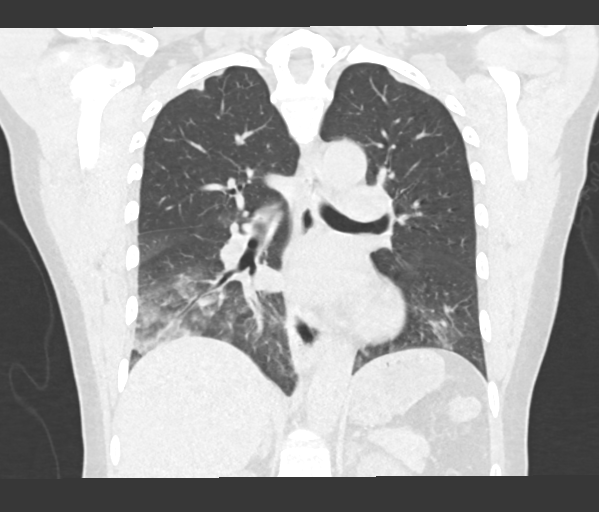
[im 76/127  lung]
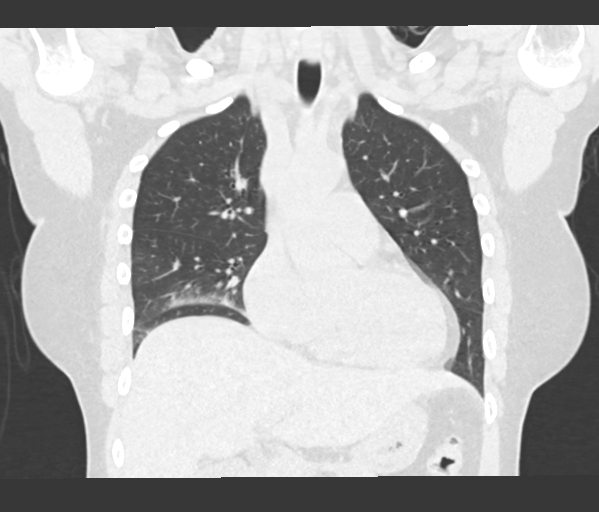

[15 of 36 positions shown; findings below may reference images not displayed]

FINDINGS: Cardiovascular: No significant vascular findings. Normal heart size.
No pericardial effusion.

Mediastinum/Nodes: No enlarged mediastinal or axillary lymph nodes.
Thyroid gland, trachea, and esophagus demonstrate no significant
findings.

Lungs/Pleura: No pneumothorax pleural effusion is noted. Bilateral
lower lobe and to some degree right lower lobe airspace and
reticular opacities are noted, right greater than left, concerning
for pulmonary edema or possibly multifocal pneumonia.

Upper Abdomen: No acute abnormality.

Musculoskeletal: No chest wall mass or suspicious bone lesions
identified.
IMPRESSION: Bibasilar opacities are noted, right greater than left, concerning
for pulmonary edema or possibly multifocal pneumonia.

## 2022-05-21 IMAGING — DX DG CHEST 1V PORT
1 series · 1 of 1 positions shown · non-contrast
Comparison: 10/31/2018

CLINICAL DATA: Overdose.

EXAM:
PORTABLE CHEST 1 VIEW

[chest ap]
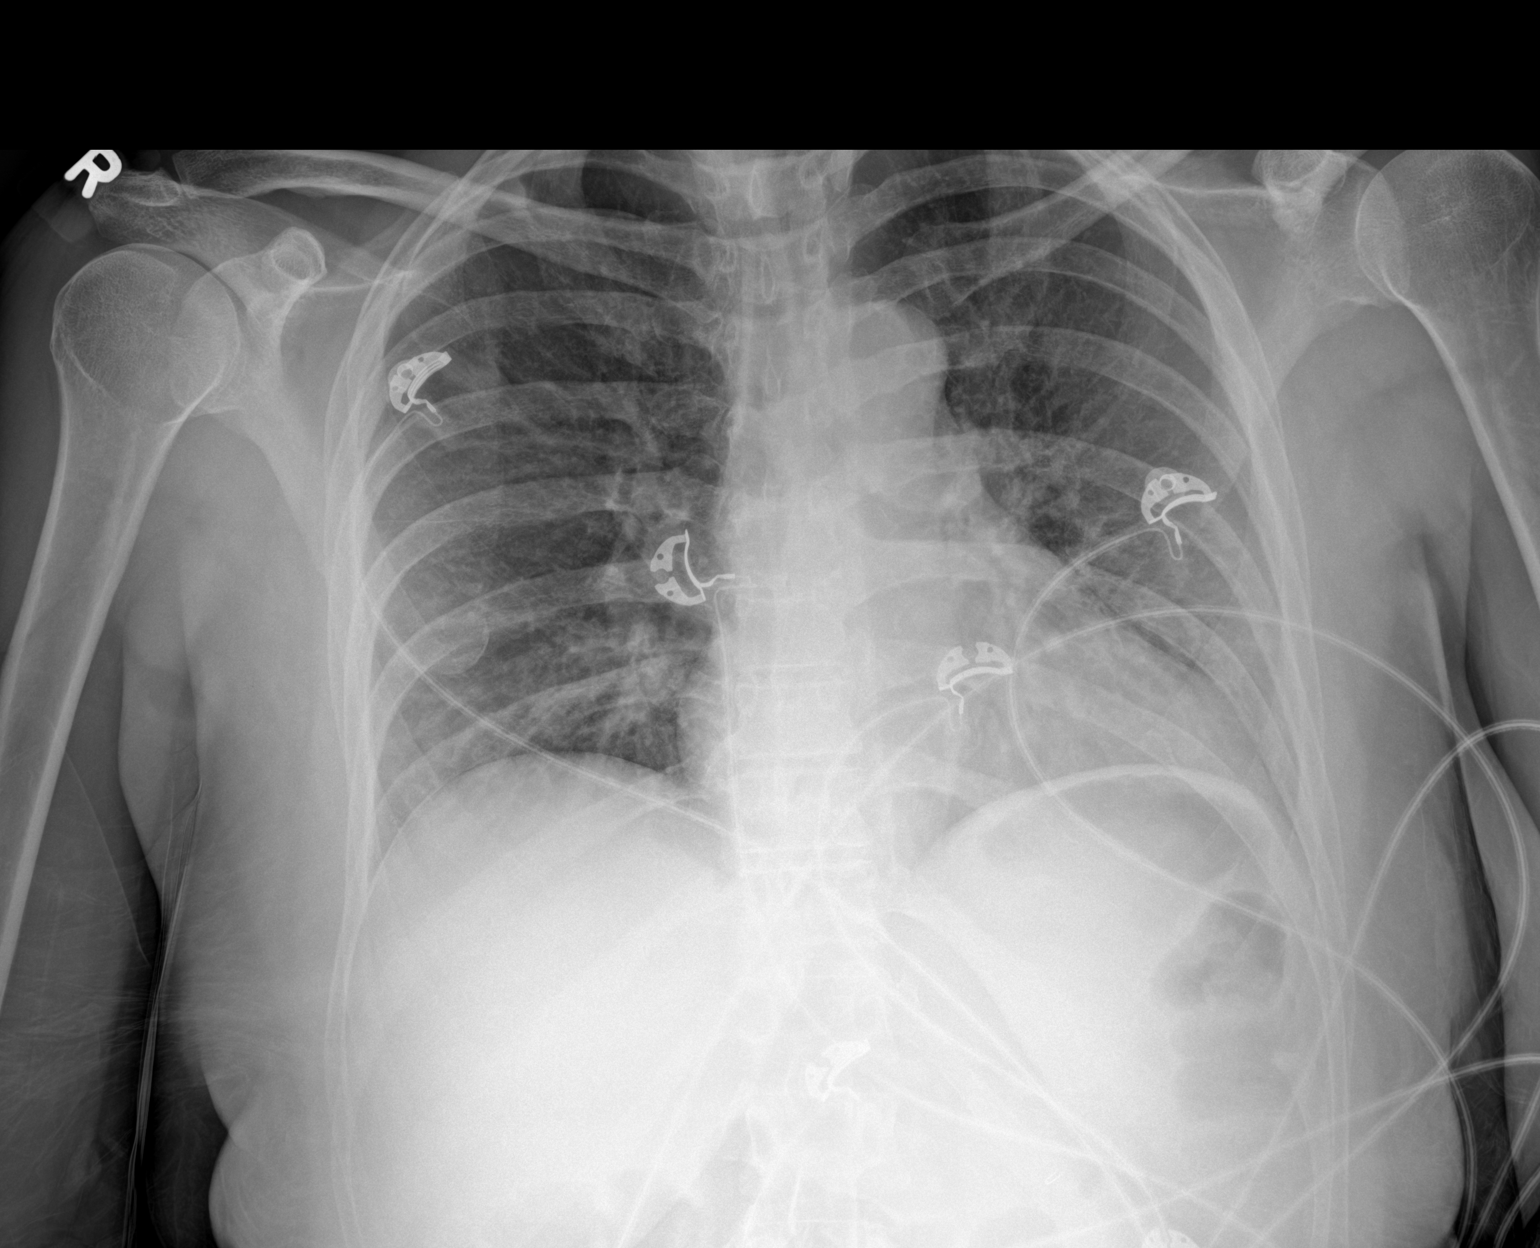

[1 of 1 positions shown; findings below may reference images not displayed]

FINDINGS: Numerous leads and wires project over the chest. The patient is
rotated minimally left. Normal heart size. No pleural effusion or
pneumothorax. Vague increased density projecting over the lung bases
is favored to be due to AP portable technique, poor inspiratory
effort, and overlying breast tissue. Biapical pleuroparenchymal
scarring is mild.
IMPRESSION: Bibasilar increased density, favoring a combination of atelectasis
and overlying breast tissue. If clinical concern of pneumonia or
aspiration, consider PA and lateral radiographs.

## 2023-10-18 ENCOUNTER — Ambulatory Visit: Payer: Self-pay

## 2024-03-22 ENCOUNTER — Encounter: Payer: Self-pay | Admitting: Nurse Practitioner

## 2024-03-22 ENCOUNTER — Ambulatory Visit: Payer: Self-pay | Admitting: Nurse Practitioner

## 2024-03-22 DIAGNOSIS — B009 Herpesviral infection, unspecified: Secondary | ICD-10-CM | POA: Insufficient documentation

## 2024-03-22 DIAGNOSIS — D219 Benign neoplasm of connective and other soft tissue, unspecified: Secondary | ICD-10-CM

## 2024-03-22 DIAGNOSIS — Z113 Encounter for screening for infections with a predominantly sexual mode of transmission: Secondary | ICD-10-CM

## 2024-03-22 LAB — HM HEPATITIS C SCREENING LAB: HM Hepatitis Screen: NEGATIVE

## 2024-03-22 LAB — WET PREP FOR TRICH, YEAST, CLUE
Trichomonas Exam: NEGATIVE
Yeast Exam: NEGATIVE

## 2024-03-22 LAB — HM HIV SCREENING LAB: HM HIV Screening: NEGATIVE

## 2024-03-22 MED ORDER — VALACYCLOVIR HCL 1 G PO TABS: 1000.0000 mg | ORAL_TABLET | Freq: Every day | ORAL | 10 refills | Status: AC

## 2024-03-22 MED ORDER — METRONIDAZOLE 500 MG PO TABS: 500.0000 mg | ORAL_TABLET | Freq: Two times a day (BID) | ORAL | Status: AC

## 2024-03-22 NOTE — Progress Notes (Addendum)
 Pt is here for std screening, wet prep results reviewed with pt no treatment required per standing order.  The patient was dispensed metronidazole #14  today. I provided counseling today regarding the medication. We discussed the medication, the side effects and when to call clinic. Patient given the opportunity to ask questions. Questions answered.  Condoms given. Caren Channel, RN

## 2024-03-22 NOTE — Progress Notes (Signed)
 [facility] Department STI clinic [address] [place] [address] Main phone: [phone]  STI screening visit  Subjective:  [name] Santana is a 53 y.o. female being seen today for an STI screening visit. The patient reports they do have symptoms.  Patient reports that they do not desire a pregnancy in the next year.   They reported they are not interested in discussing contraception today.    No LMP recorded. (Menstrual status: Perimenopausal).  Patient has the following medical conditions:  Patient Active Problem List   Diagnosis Date Noted   HSV infection 03/22/2024   Fibroids 03/22/2024   Multifocal pneumonia 04/03/2022   Acute respiratory failure with hypoxia (HCC) 04/03/2022   Polysubstance abuse (HCC)    Overdose 04/02/2022   CAP (community acquired pneumonia) 04/02/2022   Lactic acidosis 04/02/2022   AKI (acute kidney injury) (HCC) 04/02/2022   Pericarditis 04/08/2014   Chest pain of pericarditis 04/08/2014   ST segment elevation - pericarditis 04/08/2014   Acute pericarditis 04/08/2014   Vasculitis [facility])    Chief Complaint  Patient presents with   SEXUALLY TRANSMITTED DISEASE    HPI HPI Patient reports genital itching that started 2 weeks ago. She notes she has a history of HSV, but has run out of her medication. She also reports mild left abdominal pain and notes a history of 5 golf ball sized fibroids that were diagnosed in 2015.   Last HIV test per patient/review of record was No results found for: "HMHIVSCREEN"  Lab Results  Component Value Date   HIV Non Reactive 04/02/2022    Unknown last Hep B or C testing.  Patients last pap date is unknown.   Screening for MPX risk: Does the patient have an unexplained rash? No Is the patient MSM? No Does the patient endorse multiple sex partners or anonymous sex partners? No Did the patient have close or sexual contact with a person diagnosed with MPX? No Has the  patient traveled outside the [place]  where MPX is endemic? No Is there a high clinical suspicion for MPX-- evidenced by one of the following No  -Unlikely to be chickenpox  -Lymphadenopathy  -Rash that present in same phase of evolution on any given body part See flowsheet for further details and programmatic requirements.   Immunization history:  Immunization History  Administered Date(s) Administered   Moderna Covid-19 Vaccine Bivalent Booster 69yrs & up 08/27/2021   Moderna Sars-Covid-2 Vaccination 08/25/2020, 09/24/2020   Tdap 12/21/2011     The following portions of the patient's history were reviewed and updated as appropriate: allergies, current medications, past medical history, past social history, past surgical history and problem list.  Objective:  There were no vitals filed for this visit.  Physical Exam Vitals and nursing note reviewed. Exam conducted with a chaperone present Carlon Chester, MA).  Constitutional:      Appearance: Normal appearance.  HENT:     Head: Normocephalic and atraumatic.     Mouth/Throat:     Mouth: Mucous membranes are moist.     Pharynx: Oropharynx is clear. No oropharyngeal exudate or posterior oropharyngeal erythema.  Pulmonary:     Effort: Pulmonary effort is normal.  Abdominal:     General: Abdomen is flat.     Palpations: There is no mass.     Tenderness: There is no abdominal tenderness. There is no rebound.  Genitourinary:    General: Normal vulva.     Exam position: Lithotomy position.     Pubic Area:  No rash or pubic lice.      Labia:        Right: No rash or lesion.        Left: No rash or lesion.      Vagina: Vaginal discharge (homogenous white adherent) present. No erythema, bleeding or lesions.     Cervix: No cervical motion tenderness, discharge, friability, lesion or erythema.     Uterus: Enlarged.      Adnexa:        Right: No tenderness.         Left: Tenderness present.      Rectum: Normal.     Comments: No pH paper in the  room Lymphadenopathy:     Head:     Right side of head: No preauricular or posterior auricular adenopathy.     Left side of head: No preauricular or posterior auricular adenopathy.     Cervical: No cervical adenopathy.     Upper Body:     Right upper body: No supraclavicular, axillary or epitrochlear adenopathy.     Left upper body: No supraclavicular, axillary or epitrochlear adenopathy.     Lower Body: No right inguinal adenopathy. No left inguinal adenopathy.  Skin:    General: Skin is warm and dry.     Findings: No rash.  Neurological:     Mental Status: She is alert and oriented to person, place, and time.  Psychiatric:        Mood and Affect: Mood normal.        Behavior: Behavior normal.     Assessment and Plan:  [name] Santana is a 53 y.o. female presenting to the [facility] Department for STI screening Pt admitted current homelessness. Given housing resources handout. Pt smokes. Given quitline card.  1. Screening for venereal disease Will treat for BV given +whiff, +clue cells, adherent white discharge (3/5 amsel criteria. pH not tested as there was no nitrazine paper in the room at time of exam)  Given full clinical picture (pt has mild left abdominal pain and on exam mild left adnexal tenderness however she has a significant hx of fibroids, no WBCs on wet prep, no CMT, no erythema or mucopurulent discharge) will not treat presumptively for PID, will wait for labs to return. Given strict ED precautions to the pt.   - Syphilis Serology, [place] [facility] - Chlamydia/Gonorrhea [place] [facility] - HBV Antigen/Antibody [facility] - HIV/HCV  Lab - WET PREP FOR TRICH, YEAST, CLUE - Gonococcus culture - oral  2. Fibroids (Primary) Pt reports a hx of 5 golfball sized fibroids dx in 2015  3. HSV infection Sent rx to pharmacy for 1gm tablet a day for 5 days for episodic treatment. Pt declines acyclovir as she feels it didn't work in the past and made her more  itchy.   Patient accepted all screenings including oral, vaginal CT/GC and bloodwork for HIV/RPR, and wet prep. Patient meets criteria for HepB screening? Yes. Ordered? yes Patient meets criteria for HepC screening? Yes. Ordered? yes  Treat wet prep per standing order Discussed time line for [facility] results and that patient will be called with positive results and encouraged patient to call if she had not heard in 2 weeks.  Counseled to return or seek care for continued or worsening symptoms Recommended repeat testing in 3 months with positive results. Recommended condom use with all sex for STI prevention.   Patient is currently using  nothing  to prevent pregnancy.  Return if symptoms worsen or fail to improve.  No future appointments.  [name] Santana K Antoniette Peake, NP

## 2024-03-27 LAB — HBV ANTIGEN/ANTIBODY STATE LAB
Hep B Core Total Ab: NONREACTIVE
Hep B S Ab: NONREACTIVE
Hepatitis B Surface Antigen: NONREACTIVE

## 2024-03-28 LAB — GONOCOCCUS CULTURE

## 2024-04-04 ENCOUNTER — Encounter: Payer: Self-pay | Admitting: Nurse Practitioner

## 2024-06-13 ENCOUNTER — Ambulatory Visit: Payer: Self-pay

## 2024-09-02 ENCOUNTER — Encounter (INDEPENDENT_AMBULATORY_CARE_PROVIDER_SITE_OTHER): Payer: Self-pay
